# Patient Record
Sex: Male | Born: 1955 | Race: White | Hispanic: No | Marital: Single | Smoking: Current every day smoker
Health system: Southern US, Community
[De-identification: ages and names within clinical notes are randomized; demographics above are authoritative.]

## PROBLEM LIST (undated history)

## (undated) DIAGNOSIS — I2510 Atherosclerotic heart disease of native coronary artery without angina pectoris: Secondary | ICD-10-CM

## (undated) DIAGNOSIS — D649 Anemia, unspecified: Secondary | ICD-10-CM

## (undated) DIAGNOSIS — I219 Acute myocardial infarction, unspecified: Secondary | ICD-10-CM

## (undated) DIAGNOSIS — M75100 Unspecified rotator cuff tear or rupture of unspecified shoulder, not specified as traumatic: Secondary | ICD-10-CM

## (undated) DIAGNOSIS — Z720 Tobacco use: Secondary | ICD-10-CM

## (undated) DIAGNOSIS — M479 Spondylosis, unspecified: Secondary | ICD-10-CM

## (undated) DIAGNOSIS — N200 Calculus of kidney: Secondary | ICD-10-CM

## (undated) DIAGNOSIS — M549 Dorsalgia, unspecified: Secondary | ICD-10-CM

## (undated) DIAGNOSIS — I10 Essential (primary) hypertension: Secondary | ICD-10-CM

## (undated) DIAGNOSIS — J449 Chronic obstructive pulmonary disease, unspecified: Secondary | ICD-10-CM

## (undated) DIAGNOSIS — I5032 Chronic diastolic (congestive) heart failure: Secondary | ICD-10-CM

## (undated) DIAGNOSIS — G8929 Other chronic pain: Secondary | ICD-10-CM

## (undated) HISTORY — DX: Calculus of kidney: N20.0

## (undated) HISTORY — DX: Atherosclerotic heart disease of native coronary artery without angina pectoris: I25.10

## (undated) HISTORY — PX: OTHER SURGICAL HISTORY: SHX169

## (undated) HISTORY — DX: Dorsalgia, unspecified: M54.9

## (undated) HISTORY — PX: WRIST SURGERY: SHX841

## (undated) HISTORY — DX: Unspecified rotator cuff tear or rupture of unspecified shoulder, not specified as traumatic: M75.100

## (undated) HISTORY — DX: Other chronic pain: G89.29

## (undated) HISTORY — PX: NOSE SURGERY: SHX723

## (undated) HISTORY — DX: Acute myocardial infarction, unspecified: I21.9

## (undated) HISTORY — DX: Chronic diastolic (congestive) heart failure: I50.32

## (undated) HISTORY — DX: Spondylosis, unspecified: M47.9

## (undated) HISTORY — DX: Anemia, unspecified: D64.9

## (undated) HISTORY — DX: Chronic obstructive pulmonary disease, unspecified: J44.9

## (undated) HISTORY — PX: KNEE SURGERY: SHX244

## (undated) HISTORY — PX: SHOULDER SURGERY: SHX246

## (undated) HISTORY — DX: Tobacco use: Z72.0

## (undated) HISTORY — PX: CARPAL TUNNEL RELEASE: SHX101

## (undated) HISTORY — DX: Essential (primary) hypertension: I10

---

## 1998-02-11 ENCOUNTER — Emergency Department (HOSPITAL_COMMUNITY): Admission: EM | Admit: 1998-02-11 | Discharge: 1998-02-11 | Payer: Self-pay | Admitting: Emergency Medicine

## 1998-04-23 ENCOUNTER — Emergency Department (HOSPITAL_COMMUNITY): Admission: EM | Admit: 1998-04-23 | Discharge: 1998-04-23 | Payer: Self-pay | Admitting: Emergency Medicine

## 1998-04-23 ENCOUNTER — Encounter: Payer: Self-pay | Admitting: Emergency Medicine

## 2000-09-05 ENCOUNTER — Emergency Department (HOSPITAL_COMMUNITY): Admission: EM | Admit: 2000-09-05 | Discharge: 2000-09-05 | Payer: Self-pay | Admitting: Emergency Medicine

## 2000-09-05 ENCOUNTER — Encounter: Payer: Self-pay | Admitting: Emergency Medicine

## 2000-12-01 ENCOUNTER — Encounter: Payer: Self-pay | Admitting: *Deleted

## 2000-12-01 ENCOUNTER — Emergency Department (HOSPITAL_COMMUNITY): Admission: EM | Admit: 2000-12-01 | Discharge: 2000-12-01 | Payer: Self-pay | Admitting: *Deleted

## 2000-12-07 ENCOUNTER — Emergency Department (HOSPITAL_COMMUNITY): Admission: EM | Admit: 2000-12-07 | Discharge: 2000-12-07 | Payer: Self-pay | Admitting: Family Medicine

## 2001-02-14 ENCOUNTER — Encounter: Payer: Self-pay | Admitting: *Deleted

## 2001-02-14 ENCOUNTER — Emergency Department (HOSPITAL_COMMUNITY): Admission: EM | Admit: 2001-02-14 | Discharge: 2001-02-14 | Payer: Self-pay | Admitting: *Deleted

## 2001-02-17 ENCOUNTER — Emergency Department (HOSPITAL_COMMUNITY): Admission: EM | Admit: 2001-02-17 | Discharge: 2001-02-17 | Payer: Self-pay | Admitting: Internal Medicine

## 2001-02-19 ENCOUNTER — Emergency Department (HOSPITAL_COMMUNITY): Admission: EM | Admit: 2001-02-19 | Discharge: 2001-02-19 | Payer: Self-pay | Admitting: *Deleted

## 2001-08-06 ENCOUNTER — Emergency Department (HOSPITAL_COMMUNITY): Admission: EM | Admit: 2001-08-06 | Discharge: 2001-08-06 | Payer: Self-pay | Admitting: Emergency Medicine

## 2001-10-21 ENCOUNTER — Encounter: Payer: Self-pay | Admitting: Emergency Medicine

## 2001-10-21 ENCOUNTER — Emergency Department (HOSPITAL_COMMUNITY): Admission: EM | Admit: 2001-10-21 | Discharge: 2001-10-22 | Payer: Self-pay | Admitting: Emergency Medicine

## 2002-05-08 ENCOUNTER — Emergency Department (HOSPITAL_COMMUNITY): Admission: EM | Admit: 2002-05-08 | Discharge: 2002-05-08 | Payer: Self-pay | Admitting: Emergency Medicine

## 2002-05-08 ENCOUNTER — Encounter: Payer: Self-pay | Admitting: Emergency Medicine

## 2002-12-25 ENCOUNTER — Emergency Department (HOSPITAL_COMMUNITY): Admission: EM | Admit: 2002-12-25 | Discharge: 2002-12-25 | Payer: Self-pay | Admitting: Internal Medicine

## 2002-12-28 ENCOUNTER — Emergency Department (HOSPITAL_COMMUNITY): Admission: EM | Admit: 2002-12-28 | Discharge: 2002-12-28 | Payer: Self-pay | Admitting: *Deleted

## 2003-01-02 ENCOUNTER — Emergency Department (HOSPITAL_COMMUNITY): Admission: EM | Admit: 2003-01-02 | Discharge: 2003-01-02 | Payer: Self-pay | Admitting: Emergency Medicine

## 2003-01-20 ENCOUNTER — Emergency Department (HOSPITAL_COMMUNITY): Admission: EM | Admit: 2003-01-20 | Discharge: 2003-01-20 | Payer: Self-pay | Admitting: *Deleted

## 2003-01-24 ENCOUNTER — Emergency Department (HOSPITAL_COMMUNITY): Admission: EM | Admit: 2003-01-24 | Discharge: 2003-01-25 | Payer: Self-pay | Admitting: Emergency Medicine

## 2003-02-14 ENCOUNTER — Ambulatory Visit (HOSPITAL_BASED_OUTPATIENT_CLINIC_OR_DEPARTMENT_OTHER): Admission: RE | Admit: 2003-02-14 | Discharge: 2003-02-14 | Payer: Self-pay | Admitting: Urology

## 2003-11-22 ENCOUNTER — Emergency Department: Payer: Self-pay | Admitting: Unknown Physician Specialty

## 2004-09-26 ENCOUNTER — Emergency Department: Payer: Self-pay | Admitting: Emergency Medicine

## 2009-01-15 ENCOUNTER — Emergency Department: Payer: Self-pay | Admitting: Emergency Medicine

## 2009-12-18 ENCOUNTER — Emergency Department: Payer: Self-pay | Admitting: Emergency Medicine

## 2010-01-01 ENCOUNTER — Emergency Department: Payer: Self-pay | Admitting: Emergency Medicine

## 2010-12-04 ENCOUNTER — Emergency Department: Payer: Self-pay | Admitting: *Deleted

## 2011-01-28 HISTORY — PX: OTHER SURGICAL HISTORY: SHX169

## 2011-02-03 ENCOUNTER — Emergency Department: Payer: Self-pay | Admitting: *Deleted

## 2011-04-27 ENCOUNTER — Emergency Department: Payer: Self-pay | Admitting: Emergency Medicine

## 2011-04-27 LAB — URINALYSIS, COMPLETE
Bilirubin,UR: NEGATIVE
Glucose,UR: NEGATIVE mg/dL (ref 0–75)
Ketone: NEGATIVE
Ph: 5 (ref 4.5–8.0)
Protein: NEGATIVE
RBC,UR: 6 /HPF (ref 0–5)
Specific Gravity: 1.031 (ref 1.003–1.030)
Squamous Epithelial: NONE SEEN
WBC UR: 1 /HPF (ref 0–5)

## 2011-04-27 LAB — DRUG SCREEN, URINE
Benzodiazepine, Ur Scrn: NEGATIVE (ref ?–200)
Cannabinoid 50 Ng, Ur ~~LOC~~: NEGATIVE (ref ?–50)
MDMA (Ecstasy)Ur Screen: NEGATIVE (ref ?–500)
Opiate, Ur Screen: POSITIVE (ref ?–300)
Tricyclic, Ur Screen: NEGATIVE (ref ?–1000)

## 2011-05-10 ENCOUNTER — Emergency Department (HOSPITAL_COMMUNITY)
Admission: EM | Admit: 2011-05-10 | Discharge: 2011-05-10 | Disposition: A | Discharge status: Home / Self Care | Payer: Self-pay | Attending: Emergency Medicine | Admitting: Emergency Medicine

## 2011-05-10 ENCOUNTER — Encounter (HOSPITAL_COMMUNITY): Payer: Self-pay

## 2011-05-10 DIAGNOSIS — M549 Dorsalgia, unspecified: Secondary | ICD-10-CM

## 2011-05-10 DIAGNOSIS — G8929 Other chronic pain: Secondary | ICD-10-CM | POA: Insufficient documentation

## 2011-05-10 DIAGNOSIS — F17200 Nicotine dependence, unspecified, uncomplicated: Secondary | ICD-10-CM | POA: Insufficient documentation

## 2011-05-10 MED ORDER — DIAZEPAM 5 MG PO TABS: 5.0000 mg | ORAL_TABLET | Freq: Three times a day (TID) | ORAL | Status: AC | PRN

## 2011-05-10 MED ORDER — IBUPROFEN 800 MG PO TABS: 800.0000 mg | ORAL_TABLET | Freq: Three times a day (TID) | ORAL | Status: AC | PRN

## 2011-05-10 MED ORDER — HYDROCODONE-ACETAMINOPHEN 5-325 MG PO TABS: 1.0000 | ORAL_TABLET | Freq: Four times a day (QID) | ORAL | Status: AC | PRN

## 2011-05-10 NOTE — ED Provider Notes (Signed)
History     CSN: [id number]  Arrival date & time 05/10/11  1224   First MD Initiated Contact with Patient 05/10/11 1309      Chief Complaint  Patient presents with  . Back Pain    radiaiting to hips and legs    (Consider location/radiation/quality/duration/timing/severity/associated sxs/prior treatment) HPI Comments: Patient with history of chronic back pain with 2 herniated discs, cared for by neurosurgery at [facility], reports he has been taking care of a homebound person and doing a lot of heavy lifting recently, then today came to [place] to [place] someone's yard and developed worsening back pain.  His back pain is located in his lower back, radiates into his bilateral buttocks and down the back of both of his legs to the level of his knee.  Pain is currently 10/10.  States this feels exactly like his typical back pain exacerbation.  Denies fevers, N/V, abdominal pain, loss of control of bowel or bladder, weakness or numbness of his legs.    Patient is a 56 y.o. male presenting with back pain. The history is provided by the patient.  Back Pain  Pertinent negatives include no fever, no numbness, no abdominal pain, no dysuria and no weakness.    History reviewed. No pertinent past medical history.  History reviewed. No pertinent past surgical history.  No family history on file.  History  Substance Use Topics  . Smoking status: Current Some Day Smoker  . Smokeless tobacco: Not on file  . Alcohol Use: Yes      Review of Systems  Constitutional: Negative for fever.  Gastrointestinal: Negative for nausea, vomiting, abdominal pain, diarrhea and constipation.  Genitourinary: Negative for dysuria, urgency and frequency.  Musculoskeletal: Positive for back pain.  Neurological: Negative for weakness and numbness.  All other systems reviewed and are negative.    Allergies  Review of patient's allergies indicates no known allergies.  Home Medications   Current Outpatient Rx    Name Route Sig Dispense Refill  . ACETAMINOPHEN 500 MG PO TABS Oral Take 1,000 mg by mouth every 6 (six) hours as needed. pain    . ALBUTEROL SULFATE HFA 108 (90 BASE) MCG/ACT IN AERS Inhalation Inhale 2 puffs into the lungs every 6 (six) hours as needed. Wheezing and shortness of breath      BP 138/78  Pulse 54  Temp(Src) 97.7 F (36.5 C) (Oral)  Resp 20  SpO2 97%  Physical Exam  Nursing note and vitals reviewed. Constitutional: He is oriented to person, place, and time. He appears well-developed and well-nourished.  HENT:  Head: Normocephalic and atraumatic.  Neck: Normal range of motion. Neck supple.  Cardiovascular: Normal rate and regular rhythm.   Pulmonary/Chest: Effort normal and breath sounds normal.  Abdominal: Soft. He exhibits no distension and no mass. There is no tenderness. There is no rebound and no guarding.  Musculoskeletal: He exhibits no edema and no tenderness.       Cervical back: Normal.       Thoracic back: Normal.       Lumbar back: He exhibits decreased range of motion and pain. He exhibits no swelling, no edema and no deformity.  Neurological: He is alert and oriented to person, place, and time. He has normal strength. No sensory deficit. He exhibits normal muscle tone. Coordination and gait normal. GCS eye subscore is 4. GCS verbal subscore is 5. GCS motor subscore is 6.  Psychiatric: He has a normal mood and affect. His behavior is normal. Judgment  and thought content normal.    ED Course  Procedures (including critical care time)  Labs Reviewed - No data to display No results found.   1. Chronic back pain       MDM  Patient with chronic back pain, and recent heavy lifting, and activity.  Patient reports increase in chronic low back pain that is consistent with his typical flares of back pain.  No symptoms or physical findings concerning for cord compression or major nerve impingement.  Patient plans to followup with his doctors at [facility], but  currently does not have a primary care doctor or anyone to manage his pain.  Patient discharged home with pain medications with followup with his known doctors.  Patient verbalizes understanding and agrees with plan.        Rise Patience, [place] 05/10/11 1344

## 2011-05-10 NOTE — ED Notes (Signed)
Pt in from home with lower back pain radiating to hips and legs pt has a hx of chronic back pain denies recent injury

## 2011-05-10 NOTE — Discharge Instructions (Signed)
Please followup with your doctors at [facility].  Take the medications as prescribed.  It is important that you follow the same doctor for management of your chronic pain.  If you develop weakness or numbness, difficulty walking, fevers, loss of control of bowel or bladder, please go to the nearest emergency department immediately.  You may return to the ER at any time for worsening condition or any new symptoms that concern you.   Chronic Back Pain When back pain lasts longer than 3 months, it is called chronic back pain.This pain can be frustrating, but the cause of the pain is rarely dangerous.People with chronic back pain often go through certain periods that are more intense (flare-ups). CAUSES Chronic back pain can be caused by wear and tear (degeneration) on different structures in your back. These structures may include bones, ligaments, or discs. This degeneration may result in more pressure being placed on the nerves that travel to your legs and feet. This can lead to pain traveling from the low back down the back of the legs. When pain lasts longer than 3 months, it is not unusual for people to experience anxiety or depression. Anxiety and depression can also contribute to low back pain. TREATMENT  Establish a regular exercise plan. This is critical to improving your functional level.   Have a self-management plan for when you flare-up. Flare-ups rarely require a medical visit. Regular exercise will help reduce the intensity and frequency of your flare-ups.   Manage how you feel about your back pain and the rest of your life. Anxiety, depression, and feeling that you cannot alter your back pain have been shown to make back pain more intense and debilitating.   Medicines should never be your only treatment. They should be used along with other treatments to help you return to a more active lifestyle.   Procedures such as injections or surgery may be helpful but are rarely necessary. You may be  able to get the same results with physical therapy or chiropractic care.  HOME CARE INSTRUCTIONS  Avoid bending, heavy lifting, prolonged sitting, and activities which make the problem worse.   Continue normal activity as much as possible.   Take brief periods of rest throughout the day to reduce your pain during flare-ups.   Follow your back exercise rehabilitation program. This can help reduce symptoms and prevent more pain.   Only take over-the-counter or prescription medicines as directed by your caregiver. Muscle relaxants are sometimes prescribed. Narcotic pain medicine is discouraged for long-term pain, since addiction is a possible outcome.   If you smoke, quit.   Eat healthy foods and maintain a recommended body weight.  SEEK IMMEDIATE MEDICAL CARE IF:   You have weakness or numbness in one of your legs or feet.   You have trouble controlling your bladder or bowels.   You develop nausea, vomiting, abdominal pain, shortness of breath, or fainting.  Document Released: 02/21/2004 Document Revised: 01/02/2011 Document Reviewed: 12/28/2010 [facility] Patient Information 2012 [place], [place].  Chronic Pain Management Managing chronic pain is not easy. The goal is to provide as much pain relief as possible. There are emotional as well as physical problems. Chronic pain may lead to symptoms of depression which magnify those of the pain. Problems may include:  Anxiety.   Sleep disturbances.   Confused thinking.   Feeling cranky.   Fatigue.   Weight gain or loss.  Identify the source of the pain first, if possible. The pain may be masking another  problem. Try to find a pain management specialist or clinic. Work with a team to create a treatment plan for you. MEDICATIONS  May include narcotics or opioids. Larger than normal doses may be needed to control your pain.   Drugs for depression may help.   Over-the-counter medicines may help for some conditions. These drugs may  be used along with others for better pain relief.   May be injected into sites such as the spine and joints. Injections may have to be repeated if they wear off.  THERAPY MAY INCLUDE:  Working with a physical therapist to keep from getting stiff.   Regular, gentle exercise.   Cognitive or behavioral therapy.   Using complementary or integrative medicine such as:   Acupuncture.   Massage, Reiki, or Rolfing.   Aroma, color, light, or sound therapy.   Group support.  FOR MORE INFORMATION [email or web address]. American Chronic Pain Association [email or web address]. Document Released: 02/21/2004 Document Revised: 01/02/2011 Document Reviewed: 04/01/2007 [facility] Patient Information 2012 [place], [place].

## 2011-05-24 NOTE — ED Provider Notes (Signed)
Evaluation and management procedures were performed by the PA/NP/resident physician under my supervision/collaboration.   Neeraj Housand D Xandra Laramee, MD 05/24/11 1925 

## 2011-06-14 ENCOUNTER — Emergency Department: Payer: Self-pay | Admitting: *Deleted

## 2011-07-14 ENCOUNTER — Emergency Department: Payer: Self-pay | Admitting: *Deleted

## 2011-07-14 LAB — CBC
HGB: 13.6 g/dL (ref 13.0–18.0)
MCH: 33.4 pg (ref 26.0–34.0)
MCV: 97 fL (ref 80–100)
Platelet: 215 10*3/uL (ref 150–440)
RBC: 4.08 10*6/uL — ABNORMAL LOW (ref 4.40–5.90)

## 2011-07-14 LAB — URINALYSIS, COMPLETE
Bacteria: NONE SEEN
Bilirubin,UR: NEGATIVE
RBC,UR: 3 /HPF (ref 0–5)
Specific Gravity: 1.028 (ref 1.003–1.030)
Squamous Epithelial: <1
WBC UR: 9 /HPF (ref 0–5)

## 2011-07-14 LAB — COMPREHENSIVE METABOLIC PANEL
Alkaline Phosphatase: 112 U/L (ref 50–136)
Co2: 27 mmol/L (ref 21–32)
Creatinine: 0.72 mg/dL (ref 0.60–1.30)
EGFR (African American): >60
EGFR (Non-African Amer.): >60
Glucose: 96 mg/dL (ref 65–99)
Osmolality: 277 (ref 275–301)
Potassium: 3.6 mmol/L (ref 3.5–5.1)
SGOT(AST): 57 U/L — ABNORMAL HIGH (ref 15–37)
SGPT (ALT): 79 U/L — ABNORMAL HIGH
Total Protein: 7.6 g/dL (ref 6.4–8.2)

## 2011-08-18 ENCOUNTER — Emergency Department: Payer: Self-pay | Admitting: *Deleted

## 2011-10-06 ENCOUNTER — Encounter (HOSPITAL_COMMUNITY): Payer: Self-pay | Admitting: Family Medicine

## 2011-10-06 ENCOUNTER — Emergency Department (HOSPITAL_COMMUNITY)
Admission: EM | Admit: 2011-10-06 | Discharge: 2011-10-06 | Disposition: A | Discharge status: Home / Self Care | Payer: Medicaid Other | Attending: Emergency Medicine | Admitting: Emergency Medicine

## 2011-10-06 ENCOUNTER — Emergency Department (HOSPITAL_COMMUNITY): Payer: Medicaid Other

## 2011-10-06 DIAGNOSIS — Z87442 Personal history of urinary calculi: Secondary | ICD-10-CM | POA: Insufficient documentation

## 2011-10-06 DIAGNOSIS — M545 Low back pain, unspecified: Secondary | ICD-10-CM | POA: Insufficient documentation

## 2011-10-06 DIAGNOSIS — M533 Sacrococcygeal disorders, not elsewhere classified: Secondary | ICD-10-CM

## 2011-10-06 DIAGNOSIS — W1809XA Striking against other object with subsequent fall, initial encounter: Secondary | ICD-10-CM | POA: Insufficient documentation

## 2011-10-06 DIAGNOSIS — F17200 Nicotine dependence, unspecified, uncomplicated: Secondary | ICD-10-CM | POA: Insufficient documentation

## 2011-10-06 MED ORDER — ACETAMINOPHEN-CODEINE #3 300-30 MG PO TABS: 1.0000 | ORAL_TABLET | Freq: Four times a day (QID) | ORAL | Status: AC | PRN

## 2011-10-06 NOTE — ED Provider Notes (Signed)
History     CSN: [id number]  Arrival date & time 10/06/11  1458   First MD Initiated Contact with Patient 10/06/11 1658      Chief Complaint  Patient presents with  . Fall  . Leg Pain  . 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 The other person fell on top of him. He states he fell right onto his tail bone. He now has "really bad" no bone pain and low back pain. 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 He does admit to pain shooting down his left leg, however he states this is nothing different from his normal pain. Denies any bowel or bladder dysfunction or saddle anesthesia. Denies pain with defecation. Pain worse with sitting on his tailbone and walking. He took an aspirin this morning without any relief of his pain. He normally takes Tylenol for his chronic back pain.  Patient is a 56 y.o. male presenting with fall, leg pain, and back pain. The history is provided by the patient.  Fall Pertinent negatives include no abdominal pain. Numbness: no worse than his "normal"  Leg Pain  Numbness: no worse than his "normal"  Back Pain  Associated symptoms include leg pain. Pertinent negatives include no chest pain, no abdominal pain and no weakness. Numbness: no worse than his "normal"    Past Medical History  Diagnosis Date  . Kidney stones     Past Surgical History  Procedure Date  . Shoulder surgery   . Knee surgery   . Wrist surgery   . Nose surgery     History reviewed. No pertinent family history.  History  Substance Use Topics  . Smoking status: Current Some Day Smoker  . Smokeless tobacco: Not on file  . Alcohol Use: Yes      Review of Systems  Constitutional: Negative for  activity change.  HENT: Negative for neck pain and neck stiffness.   Respiratory: Negative for shortness of breath.   Cardiovascular: Negative for chest pain.  Gastrointestinal: Negative for abdominal pain.       No bowel dysfunction. No pain with defacation  Genitourinary:       No urinary dysfunction  Musculoskeletal: Positive for back pain and gait problem.  Skin: Negative for color change.  Neurological: Negative for dizziness, weakness and light-headedness. Numbness: no worse than his "normal"    Allergies  Review of patient's allergies indicates no known allergies.  Home Medications   Current Outpatient Rx  Name Route Sig Dispense Refill  . ACETAMINOPHEN 500 MG PO TABS Oral Take 1,000 mg by mouth every 6 (six) hours as needed. pain    . ALBUTEROL SULFATE HFA 108 (90 BASE) MCG/ACT IN AERS Inhalation Inhale 2 puffs into the lungs every 6 (six) hours as needed. Wheezing and shortness of breath    . ASPIRIN EC 81 MG PO TBEC Oral Take 81 mg by mouth once.      BP 142/101  Pulse 112  Temp 98.2 F (36.8 C) (Oral)  Resp 18  SpO2 97%  Physical Exam  Constitutional: He is oriented to person, place, and time. He appears well-developed and well-nourished. No distress.  HENT:  Head: Normocephalic and atraumatic.  Mouth/Throat: Oropharynx is clear and moist.  Eyes:  Conjunctivae are normal.  Neck: Normal range of motion. Neck supple.  Cardiovascular: Normal rate, regular rhythm and normal heart sounds.   Pulmonary/Chest: Effort normal and breath sounds normal.  Abdominal: Soft. Bowel sounds are normal. There is no tenderness.  Musculoskeletal:       Lumbar back: He exhibits decreased range of motion and bony tenderness. He exhibits no swelling, no edema and normal pulse.       No paraspinal muscle tenderness. Tenderness to palpation of sacrum and coccyx.   Neurological: He is alert and oriented to person, place, and time. He has normal reflexes. No sensory deficit. Gait (due to  pain) abnormal.       Negative SLR bilaterally  Skin: Skin is warm and dry. No rash noted. No erythema.  Psychiatric: He has a normal mood and affect. His speech is normal and behavior is normal.    ED Course  Procedures (including critical care time)  Labs Reviewed - No data to display Dg Lumbar Spine Complete  10/06/2011  *RADIOLOGY REPORT*  Clinical Data: Fall.  Low back pain.  LUMBAR SPINE - COMPLETE 4+ VIEW  Comparison: No comparison studies available.  Findings: Bones are diffusely demineralized.  No evidence for fracture.  Degenerative spondylolisthesis is seen at L4-5.  There is loss of disc height at L5-S1. SI joints are unremarkable.  Dense calcification is seen in the wall of the abdominal aorta.  IMPRESSION: No evidence for lumbar spine fracture.   Original Report Authenticated By: ERIC A. MANSELL, M.D.    Dg Sacrum/coccyx  10/06/2011  *RADIOLOGY REPORT*  Clinical Data: Fall.  Sacral pain.  SACRUM AND COCCYX - 2+ VIEW  Comparison: None.  Findings: Three-view exam of the sacrum shows no evidence for fracture.  Arcuate lines are preserved on the frontal images.  SI joints are normal.  Visualized portions of the symphysis pubis are unremarkable.  IMPRESSION: No evidence for acute sacral fracture.   Original Report Authenticated By: ERIC A. MANSELL, M.D.      1. Low back pain   2. Coccyx pain       MDM  56 year old male with low back and tailbone pain after falling yesterday. He denies any neurologic symptoms out of his normal. No red flags concerning patient's back pain. No s/s of central cord compression or cauda equina. Lower extremities are neurovascularly intact. X-rays show no acute abnormality. Will discharge with pain control and discuss conservative measures such as icing and using a donut to sit on.        Trevor Mace, PA-C 10/06/11 [place]

## 2011-10-06 NOTE — ED Notes (Signed)
Pt reports he was trying to help someone who had fallen and the rug slipped out from under him and he landed on hit bottom. Reports pain to tailbone, lower back, left leg since the fall. Denies hitting head or loc. NAD noted at this time.

## 2011-10-07 NOTE — ED Provider Notes (Signed)
Medical screening examination/treatment/procedure(s) were performed by non-physician practitioner and as supervising physician I was immediately available for consultation/collaboration.   Gwyneth Sprout, MD 10/07/11 1346

## 2011-10-11 ENCOUNTER — Emergency Department: Payer: Self-pay | Admitting: Emergency Medicine

## 2011-11-19 ENCOUNTER — Ambulatory Visit: Payer: Self-pay

## 2011-12-13 LAB — COMPREHENSIVE METABOLIC PANEL
Albumin: 2.8 g/dL — ABNORMAL LOW (ref 3.4–5.0)
Alkaline Phosphatase: 133 U/L (ref 50–136)
BUN: 28 mg/dL — ABNORMAL HIGH (ref 7–18)
Chloride: 97 mmol/L — ABNORMAL LOW (ref 98–107)
EGFR (African American): >60
Glucose: 110 mg/dL — ABNORMAL HIGH (ref 65–99)
Osmolality: 274 (ref 275–301)
SGOT(AST): 286 U/L — ABNORMAL HIGH (ref 15–37)
SGPT (ALT): 741 U/L — ABNORMAL HIGH (ref 12–78)
Total Protein: 6.4 g/dL (ref 6.4–8.2)

## 2011-12-13 LAB — TROPONIN I: Troponin-I: 2.2 ng/mL — ABNORMAL HIGH

## 2011-12-13 LAB — CBC
HGB: 10.3 g/dL — ABNORMAL LOW (ref 13.0–18.0)
MCH: 31.7 pg (ref 26.0–34.0)
MCHC: 33.9 g/dL (ref 32.0–36.0)
MCV: 93 fL (ref 80–100)
Platelet: 132 10*3/uL — ABNORMAL LOW (ref 150–440)
RBC: 3.24 10*6/uL — ABNORMAL LOW (ref 4.40–5.90)
RDW: 13.7 % (ref 11.5–14.5)

## 2011-12-13 LAB — CK TOTAL AND CKMB (NOT AT ARMC)
CK, Total: 215 U/L (ref 35–232)
CK-MB: 5.4 ng/mL — ABNORMAL HIGH (ref 0.5–3.6)

## 2011-12-14 ENCOUNTER — Inpatient Hospital Stay: Payer: Self-pay | Admitting: Internal Medicine

## 2011-12-14 DIAGNOSIS — R0602 Shortness of breath: Secondary | ICD-10-CM

## 2011-12-14 DIAGNOSIS — I059 Rheumatic mitral valve disease, unspecified: Secondary | ICD-10-CM

## 2011-12-14 DIAGNOSIS — R7989 Other specified abnormal findings of blood chemistry: Secondary | ICD-10-CM

## 2011-12-14 LAB — APTT
Activated PTT: 52.8 secs — ABNORMAL HIGH (ref 23.6–35.9)
Activated PTT: 58.7 secs — ABNORMAL HIGH (ref 23.6–35.9)

## 2011-12-14 LAB — PROTIME-INR
INR: 1.2
Prothrombin Time: 15.4 secs — ABNORMAL HIGH (ref 11.5–14.7)

## 2011-12-14 LAB — TROPONIN I: Troponin-I: 1.8 ng/mL — ABNORMAL HIGH

## 2011-12-14 LAB — POTASSIUM: Potassium: 4.5 mmol/L (ref 3.5–5.1)

## 2011-12-15 DIAGNOSIS — I2510 Atherosclerotic heart disease of native coronary artery without angina pectoris: Secondary | ICD-10-CM

## 2011-12-15 HISTORY — PX: CARDIAC CATHETERIZATION: SHX172

## 2011-12-15 LAB — CBC WITH DIFFERENTIAL/PLATELET
Basophil %: 1 %
Eosinophil #: 0.1 10*3/uL (ref 0.0–0.7)
Eosinophil %: 0.8 %
HCT: 31.3 % — ABNORMAL LOW (ref 40.0–52.0)
HGB: 10.9 g/dL — ABNORMAL LOW (ref 13.0–18.0)
Lymphocyte #: 1.4 10*3/uL (ref 1.0–3.6)
MCH: 32.7 pg (ref 26.0–34.0)
MCHC: 34.8 g/dL (ref 32.0–36.0)
MCV: 94 fL (ref 80–100)
Monocyte #: 0.8 x10 3/mm (ref 0.2–1.0)
Monocyte %: 8 %
Neutrophil #: 8 10*3/uL — ABNORMAL HIGH (ref 1.4–6.5)
Platelet: 129 10*3/uL — ABNORMAL LOW (ref 150–440)
RBC: 3.33 10*6/uL — ABNORMAL LOW (ref 4.40–5.90)

## 2011-12-15 LAB — APTT: Activated PTT: 70 secs — ABNORMAL HIGH (ref 23.6–35.9)

## 2011-12-15 LAB — BASIC METABOLIC PANEL
BUN: 19 mg/dL — ABNORMAL HIGH (ref 7–18)
Calcium, Total: 8 mg/dL — ABNORMAL LOW (ref 8.5–10.1)
Chloride: 100 mmol/L (ref 98–107)
Co2: 33 mmol/L — ABNORMAL HIGH (ref 21–32)
Creatinine: 0.75 mg/dL (ref 0.60–1.30)
EGFR (African American): >60
EGFR (Non-African Amer.): >60
Glucose: 121 mg/dL — ABNORMAL HIGH (ref 65–99)
Osmolality: 277 (ref 275–301)
Potassium: 3.5 mmol/L (ref 3.5–5.1)

## 2011-12-15 LAB — LIPID PANEL
Ldl Cholesterol, Calc: 84 mg/dL (ref 0–100)
Triglycerides: 138 mg/dL (ref 0–200)
VLDL Cholesterol, Calc: 28 mg/dL (ref 5–40)

## 2011-12-15 LAB — PRO B NATRIURETIC PEPTIDE: B-Type Natriuretic Peptide: 1293 pg/mL — ABNORMAL HIGH (ref 0–125)

## 2011-12-15 LAB — HEMOGLOBIN A1C: Hemoglobin A1C: 6.1 % (ref 4.2–6.3)

## 2011-12-16 ENCOUNTER — Encounter: Payer: Self-pay | Admitting: Cardiovascular Disease

## 2011-12-16 DIAGNOSIS — I214 Non-ST elevation (NSTEMI) myocardial infarction: Secondary | ICD-10-CM

## 2011-12-16 LAB — COMPREHENSIVE METABOLIC PANEL
Alkaline Phosphatase: 99 U/L (ref 50–136)
Anion Gap: 5 — ABNORMAL LOW (ref 7–16)
BUN: 17 mg/dL (ref 7–18)
Bilirubin,Total: 1 mg/dL (ref 0.2–1.0)
Calcium, Total: 7.9 mg/dL — ABNORMAL LOW (ref 8.5–10.1)
Chloride: 108 mmol/L — ABNORMAL HIGH (ref 98–107)
Co2: 27 mmol/L (ref 21–32)
Creatinine: 0.64 mg/dL (ref 0.60–1.30)
EGFR (Non-African Amer.): >60
Osmolality: 280 (ref 275–301)
Potassium: 3.9 mmol/L (ref 3.5–5.1)
SGOT(AST): 67 U/L — ABNORMAL HIGH (ref 15–37)
SGPT (ALT): 271 U/L — ABNORMAL HIGH (ref 12–78)
Total Protein: 5.9 g/dL — ABNORMAL LOW (ref 6.4–8.2)

## 2011-12-29 ENCOUNTER — Encounter: Payer: Self-pay | Admitting: Cardiovascular Disease

## 2012-01-26 ENCOUNTER — Inpatient Hospital Stay: Payer: Self-pay | Admitting: Family Medicine

## 2012-01-26 LAB — CBC
HCT: 34.7 % — ABNORMAL LOW (ref 40.0–52.0)
HGB: 11.6 g/dL — ABNORMAL LOW (ref 13.0–18.0)
MCH: 29.2 pg (ref 26.0–34.0)
MCHC: 33.5 g/dL (ref 32.0–36.0)
MCV: 87 fL (ref 80–100)
Platelet: 260 10*3/uL (ref 150–440)
RBC: 3.98 10*6/uL — ABNORMAL LOW (ref 4.40–5.90)

## 2012-01-26 LAB — BASIC METABOLIC PANEL
BUN: 12 mg/dL (ref 7–18)
Creatinine: 0.69 mg/dL (ref 0.60–1.30)
EGFR (African American): >60
EGFR (Non-African Amer.): >60
Glucose: 101 mg/dL — ABNORMAL HIGH (ref 65–99)
Sodium: 143 mmol/L (ref 136–145)

## 2012-01-26 LAB — CK TOTAL AND CKMB (NOT AT ARMC): CK, Total: 58 U/L (ref 35–232)

## 2012-01-27 DIAGNOSIS — R079 Chest pain, unspecified: Secondary | ICD-10-CM

## 2012-01-27 LAB — CBC WITH DIFFERENTIAL/PLATELET
Eosinophil #: 0.4 10*3/uL (ref 0.0–0.7)
Eosinophil %: 3.6 %
HCT: 32.2 % — ABNORMAL LOW (ref 40.0–52.0)
HGB: 10.7 g/dL — ABNORMAL LOW (ref 13.0–18.0)
Lymphocyte #: 2.8 10*3/uL (ref 1.0–3.6)
MCHC: 33.1 g/dL (ref 32.0–36.0)
Monocyte #: 1 x10 3/mm (ref 0.2–1.0)
Neutrophil #: 5.5 10*3/uL (ref 1.4–6.5)
Neutrophil %: 56.3 %
Platelet: 218 10*3/uL (ref 150–440)
RDW: 15.6 % — ABNORMAL HIGH (ref 11.5–14.5)
WBC: 9.9 10*3/uL (ref 3.8–10.6)

## 2012-01-27 LAB — BASIC METABOLIC PANEL
Anion Gap: 6 — ABNORMAL LOW (ref 7–16)
Calcium, Total: 8.5 mg/dL (ref 8.5–10.1)
Chloride: 108 mmol/L — ABNORMAL HIGH (ref 98–107)
Co2: 28 mmol/L (ref 21–32)
Creatinine: 0.82 mg/dL (ref 0.60–1.30)
EGFR (African American): >60
EGFR (Non-African Amer.): >60
Glucose: 88 mg/dL (ref 65–99)
Osmolality: 283 (ref 275–301)
Potassium: 3.9 mmol/L (ref 3.5–5.1)

## 2012-01-27 LAB — TROPONIN I
Troponin-I: 0.02 ng/mL
Troponin-I: <0.02 ng/mL

## 2012-01-27 LAB — CK TOTAL AND CKMB (NOT AT ARMC): CK, Total: 49 U/L (ref 35–232)

## 2012-01-29 ENCOUNTER — Telehealth: Payer: Self-pay | Admitting: Cardiovascular Disease

## 2012-01-29 ENCOUNTER — Encounter: Payer: Self-pay | Admitting: *Deleted

## 2012-01-29 NOTE — Telephone Encounter (Signed)
Error

## 2012-02-04 ENCOUNTER — Ambulatory Visit (INDEPENDENT_AMBULATORY_CARE_PROVIDER_SITE_OTHER): Payer: Medicaid Other | Admitting: Nurse Practitioner

## 2012-02-04 ENCOUNTER — Encounter: Payer: Self-pay | Admitting: Nurse Practitioner

## 2012-02-04 VITALS — BP 170/100 | HR 88 | Ht 70.0 in | Wt 226.2 lb

## 2012-02-04 DIAGNOSIS — Z720 Tobacco use: Secondary | ICD-10-CM | POA: Insufficient documentation

## 2012-02-04 DIAGNOSIS — I10 Essential (primary) hypertension: Secondary | ICD-10-CM

## 2012-02-04 DIAGNOSIS — I2510 Atherosclerotic heart disease of native coronary artery without angina pectoris: Secondary | ICD-10-CM | POA: Insufficient documentation

## 2012-02-04 DIAGNOSIS — I509 Heart failure, unspecified: Secondary | ICD-10-CM

## 2012-02-04 DIAGNOSIS — I5032 Chronic diastolic (congestive) heart failure: Secondary | ICD-10-CM

## 2012-02-04 DIAGNOSIS — R0602 Shortness of breath: Secondary | ICD-10-CM

## 2012-02-04 DIAGNOSIS — J449 Chronic obstructive pulmonary disease, unspecified: Secondary | ICD-10-CM | POA: Insufficient documentation

## 2012-02-04 DIAGNOSIS — F17200 Nicotine dependence, unspecified, uncomplicated: Secondary | ICD-10-CM

## 2012-02-04 DIAGNOSIS — M479 Spondylosis, unspecified: Secondary | ICD-10-CM | POA: Insufficient documentation

## 2012-02-04 MED ORDER — CARVEDILOL 12.5 MG PO TABS: 12.5000 mg | ORAL_TABLET | Freq: Two times a day (BID) | ORAL | Status: DC

## 2012-02-04 NOTE — Patient Instructions (Addendum)
Your physician wants you to follow-up in: 3 months with Dr. Kirke Corin. You will receive a reminder letter in the mail two months in advance. If you don't receive a letter, please call our office to schedule the follow-up appointment.  Your physician has recommended you make the following change in your medication:  -increase coreg/carvedilol to 12.5 mg twice daily

## 2012-02-04 NOTE — Progress Notes (Signed)
Patient Name: Walter [name] Date of Encounter: 02/04/2012  Primary Care Provider:  Pcp Not In System Primary Cardiologist:  M. Kirke Corin, MD  Patient Profile  57 year old male who presents for clinic followup after recent hospitalization.  Problem List   Past Medical History  Diagnosis Date  . Hypertension   . Degenerative joint disease of spine     a. 11/2011 s/p L4-L5 fusion.  . Chronic back pain   . Chronic diastolic CHF (congestive heart failure)   . COPD (chronic obstructive pulmonary disease)   . CAD (coronary artery disease)     a. 11/2011 NSTEMI/Cath: subtotal occlusion of the mid RCA with L->R collats, nl LV fxn-->medical Rx;  . Nephrolithiasis   . Tobacco abuse     a. 80+ pack year hx   Past Surgical History  Procedure Date  . Shoulder surgery   . Knee surgery   . Wrist surgery   . Nose surgery   . Carpal tunnel release   . Kidney stone     removed  . L4-l5 spine surgery 2013  . Cardiac catheterization 12/15/2011    Allergies  Allergies  Allergen Reactions  . Ibuprofen   . Tramadol     HPI  57 year old male with the above problem list. He underwent diagnostic catheterization in November of 2013 secondary to a non-STEMI following back surgery. He was found to have a subtotal occlusion of the right coronary artery and was medically managed. He was readmitted December 30 secondary to atypical chest pain hypertension and diastolic CHF. He ruled out for MI and subsequently seen by cardiology and then discharged home. Since his discharge, he has done well. He has had no recurrence of chest pain though he has chronic back pain. He is not very active at home. He has cut back on his smoking to just half a pack a day. He knows that he needs to quit. He denies PND, orthopnea, dizziness, syncope, edema, or early satiety. His blood pressure fluctuates quite a bit at home and generally runs high.  Home Medications  Prior to Admission medications   Medication Sig  Start Date End Date Taking? Authorizing Provider  acetaminophen (TYLENOL) 500 MG tablet Take 1,000 mg by mouth every 6 (six) hours as needed. pain   Yes Historical Provider, MD  albuterol (PROVENTIL HFA;VENTOLIN HFA) 108 (90 BASE) MCG/ACT inhaler Inhale 2 puffs into the lungs every 6 (six) hours as needed. Wheezing and shortness of breath   Yes Historical Provider, MD  aspirin EC 81 MG tablet Take 81 mg by mouth once.   Yes Historical Provider, MD  atorvastatin (LIPITOR) 40 MG tablet Take 40 mg by mouth daily.   Yes Historical Provider, MD  clopidogrel (PLAVIX) 75 MG tablet Take 75 mg by mouth daily.   Yes Historical Provider, MD  enalapril (VASOTEC) 2.5 MG tablet Takes 2 tablets daily.   Yes Historical Provider, MD  Fluticasone-Salmeterol (ADVAIR DISKUS) 250-50 MCG/DOSE AEPB Inhale 1 puff into the lungs every 12 (twelve) hours.   Yes Historical Provider, MD  furosemide (LASIX) 40 MG tablet Take 40 mg by mouth daily.   Yes Historical Provider, MD  gabapentin (NEURONTIN) 600 MG tablet Take 600 mg by mouth 3 (three) times daily.   Yes Historical Provider, MD  hydrochlorothiazide (HYDRODIURIL) 50 MG tablet Take 50 mg by mouth daily.   Yes Historical Provider, MD  oxycodone (OXY-IR) 5 MG capsule Take 5 mg by mouth every 6 (six) hours as needed.   Yes Historical Provider,  MD  oxyCODONE (OXYCONTIN) 20 MG 12 hr tablet Take 40 mg by mouth every 12 (twelve) hours.   Yes Historical Provider, MD  tiotropium (SPIRIVA) 18 MCG inhalation capsule Place 18 mcg into inhaler and inhale daily.   Yes Historical Provider, MD  carvedilol (COREG) 12.5 MG tablet Take 1 tablet (12.5 mg total) by mouth 2 (two) times daily. 02/04/12   Ok Anis, NP   Review of Systems  He has chronic back pain as noted above. All other systems reviewed and are otherwise negative except as noted above.  Physical Exam  Blood pressure 170/100, pulse 88, height 5\' 10"  (1.778 m), weight 226 lb 4 oz (102.626 kg).  General: Pleasant,  NAD Psych: Normal affect. Neuro: Alert and oriented X 3. Moves all extremities spontaneously. HEENT: Normal  Neck: Supple without bruits or JVD. Lungs:  Resp regular and unlabored, CTA. Heart: RRR no s3, s4, or murmurs. Abdomen: Soft, non-tender, non-distended, BS + x 4.  Extremities: No clubbing, cyanosis or edema. DP/PT/Radials 2+ and equal bilaterally.  Accessory Clinical Findings  ECG - regular sinus rhythm, 88, PACs, no acute ST or T changes.  Assessment & Plan  1.  Coronary artery disease: He is status post non-ST elevation MI and catheterization in November. He was recently admitted with atypical chest pain and subsequently ruled out. He has had no recurrence of chest pain since discharge. He has not had significant dyspnea on exertion. His blood pressure is elevated and I will titrate his carvedilol to 12.5 mg twice a day. He otherwise remains on aspirin, statin, Plavix, ACE inhibitor therapy.  2. Hypertension: Poorly controlled. Titrate carvedilol 12.5 mg twice a day. He'll check his blood pressure daily and call us back next week at which time we can decide whether or not to titrate his carvedilol further work titrate his enalapril.  3. Chronic diastolic CHF: There is for improvement of both heart rate and blood pressure and titrated beta blocker today.  4. Tobacco abuse: Cessation advised. He is down to half a pack a day.  5. Chronic back pain: Patient asked for oxycodone. I recommended he follow up with his primary care provider.  6. Disposition: Followup in 3 months.  Nicolasa Ducking, NP 02/04/2012, 4:58 PM

## 2012-02-05 LAB — BASIC METABOLIC PANEL
BUN/Creatinine Ratio: 20 (ref 9–20)
BUN: 16 mg/dL (ref 6–24)
CO2: 25 mmol/L (ref 19–28)
Calcium: 9.2 mg/dL (ref 8.7–10.2)
Chloride: 100 mmol/L (ref 97–108)
Creatinine, Ser: 0.8 mg/dL (ref 0.76–1.27)
GFR calc Af Amer: 116 mL/min/{1.73_m2} (ref >59)
GFR calc non Af Amer: 101 mL/min/{1.73_m2} (ref >59)
Glucose: 135 mg/dL — ABNORMAL HIGH (ref 65–99)
Potassium: 3.4 mmol/L — ABNORMAL LOW (ref 3.5–5.2)
Sodium: 141 mmol/L (ref 134–144)

## 2012-02-06 ENCOUNTER — Other Ambulatory Visit: Payer: Self-pay

## 2012-02-06 DIAGNOSIS — I509 Heart failure, unspecified: Secondary | ICD-10-CM

## 2012-02-06 MED ORDER — POTASSIUM CHLORIDE CRYS ER 20 MEQ PO TBCR: 40.0000 meq | EXTENDED_RELEASE_TABLET | Freq: Every day | ORAL | Status: DC

## 2012-02-10 ENCOUNTER — Telehealth: Payer: Self-pay | Admitting: *Deleted

## 2012-02-10 ENCOUNTER — Other Ambulatory Visit: Payer: Self-pay

## 2012-02-10 MED ORDER — ENALAPRIL MALEATE 20 MG PO TABS: 20.0000 mg | ORAL_TABLET | Freq: Every day | ORAL | Status: DC

## 2012-02-10 MED ORDER — CARVEDILOL 25 MG PO TABS: 25.0000 mg | ORAL_TABLET | Freq: Two times a day (BID) | ORAL | Status: DC

## 2012-02-10 NOTE — Telephone Encounter (Signed)
Spoke with patient per Sabino Snipes, RN need to have take extra carvedilol now and call us back in 45 minutes with a BP & HR reading.

## 2012-02-10 NOTE — Telephone Encounter (Signed)
Please see below and advise. thanks 

## 2012-02-10 NOTE — Telephone Encounter (Signed)
Patient calling back after he took two extra 5 mg tablets of enalapril and his BP now reading 183/109 with HR 84.

## 2012-02-10 NOTE — Telephone Encounter (Signed)
Notified patient per Dr. Kirke Corin need to increase the coreg to 25 mg twice a day and enalapril to 20 mg take one tablet daily with a follow up in one week. The patient also took BP again at his neighbors house which is 178/103 with HR 74. The patient is concerned and will probably go to the ER for evaluation.

## 2012-02-10 NOTE — Telephone Encounter (Signed)
Patient is taking Enalapril 2.5 mg twice a day; he was told to increase to 20 mg daily with an increase of carvedilol to  25 mg twice a day per Dr. Jari Sportsman instructions. The patient has taken an extra carvedilol and only (3) 2.5 mg tablets of enalapril because he was unsure of his enalapril dose and his BP now is 158/101 HR 81. Please advise. Do you recommend patient go to the ER.

## 2012-02-10 NOTE — Telephone Encounter (Signed)
FYI See below 

## 2012-02-10 NOTE — Telephone Encounter (Signed)
I spoke with pt and confirmed he has taken meds as follows today: enalapril 2.5 mg tablets x 4 (10 mg) Carvedilol 6.25 mg tablets x4 (25 mg) BP now=144/102, HR=82 I explained he has still not reached enalapril goal per Dr. Jari Sportsman last advise I explained ok to take 2.5 mg enalapril x 4 tabs to equal a total of enalapril 20 mg for today I also advised ok to take 25 mg carvedilol tonight at 9 or 10 pm to equal new dose per Dr. Jari Sportsman suggestion Understanding verb Says h/a has improved He will continue to monitor and let us know tomm how BP is doing

## 2012-02-10 NOTE — Telephone Encounter (Signed)
Patient calling with BP reading taken from pain clinic this morning 210/99, HR unknown. Dr. Fayrene Fearing with the Pain clinic in [facility] asked patient to contact cardiologist or if unable to contact, go to ER. Patient c/o headache, no dizziness, no numbness in face, shortness of breath or chest pain.  Patient seen by Brion Aliment on 02/04/2012 and mentioned that his carvedilol increased to 12.5 mg twice daily.  Patient has been taking medication as prescribed and still having elevated BP.  Patient is to f/u in 3 months with [place].  Please advise.

## 2012-02-10 NOTE — Telephone Encounter (Signed)
I advised Jasmine December to have pt take an extra 12.5 mg coreg since this was increased to 25 mg BID earlier today. He had yet to increase this, per pt.  We will call back to reassess in 1 hour.

## 2012-02-10 NOTE — Telephone Encounter (Signed)
Patient states took two enalapril 5 mg and took his BP which is now running 183/109,  HR 84. Patient is still c/o headache and not sure what to do.

## 2012-02-11 ENCOUNTER — Encounter: Payer: Self-pay | Admitting: Nurse Practitioner

## 2012-02-11 ENCOUNTER — Telehealth: Payer: Self-pay

## 2012-02-11 MED ORDER — POTASSIUM CHLORIDE CRYS ER 20 MEQ PO TBCR: 40.0000 meq | EXTENDED_RELEASE_TABLET | Freq: Every day | ORAL | Status: DC

## 2012-02-11 NOTE — Telephone Encounter (Signed)
Pt called this am to report BPs overnight Says he checked BP every hour overnight BP readings were as follows: 94/60 89/66 79/57  83/63 90/59 92/64  114/72 118/71 115/84 this am at 0730 HR=83-90 BPM Pt did not take carvedilol last PM d/t low BP Says he had some dizziness with the low BPs He took enalapril 5 mg this am with carvedilol 12.5 mg He will take enalapril 5 mg and carvedilol 12.5 mg tonight as well I advised him to continue to monitor BPs, but advised him to only check 3x a day to prevent false readings understanding verb I will inform Dr. Kirke Corin of new regimen

## 2012-02-11 NOTE — Telephone Encounter (Signed)
Continue Enalapril 5 mg and Coreg 12.5 mg bid. Take additional 12.5 mg of Coreg if SBP >160

## 2012-02-11 NOTE — Telephone Encounter (Signed)
Pt confirmed Understanding verb

## 2012-02-13 LAB — COMPREHENSIVE METABOLIC PANEL
Albumin: 3.6 g/dL (ref 3.4–5.0)
Alkaline Phosphatase: 127 U/L (ref 50–136)
Anion Gap: 8 (ref 7–16)
BUN: 22 mg/dL — ABNORMAL HIGH (ref 7–18)
Calcium, Total: 8.7 mg/dL (ref 8.5–10.1)
Chloride: 101 mmol/L (ref 98–107)
Co2: 28 mmol/L (ref 21–32)
Creatinine: 1.5 mg/dL — ABNORMAL HIGH (ref 0.60–1.30)
EGFR (African American): 60 — ABNORMAL LOW
EGFR (Non-African Amer.): 52 — ABNORMAL LOW
Glucose: 100 mg/dL — ABNORMAL HIGH (ref 65–99)
Osmolality: 277 (ref 275–301)
Potassium: 3.6 mmol/L (ref 3.5–5.1)
SGPT (ALT): 18 U/L (ref 12–78)
Total Protein: 7.5 g/dL (ref 6.4–8.2)

## 2012-02-13 LAB — URINALYSIS, COMPLETE
Bacteria: NONE SEEN
Bilirubin,UR: NEGATIVE
Hyaline Cast: 92
Ketone: NEGATIVE
Leukocyte Esterase: NEGATIVE
Nitrite: NEGATIVE
Ph: 5 (ref 4.5–8.0)
Specific Gravity: 1.011 (ref 1.003–1.030)

## 2012-02-13 LAB — CBC
HGB: 11.4 g/dL — ABNORMAL LOW (ref 13.0–18.0)
MCH: 29.4 pg (ref 26.0–34.0)
MCV: 87 fL (ref 80–100)
RBC: 3.87 10*6/uL — ABNORMAL LOW (ref 4.40–5.90)
RDW: 16.1 % — ABNORMAL HIGH (ref 11.5–14.5)
WBC: 9.6 10*3/uL (ref 3.8–10.6)

## 2012-02-13 LAB — CK TOTAL AND CKMB (NOT AT ARMC)
CK, Total: 76 U/L (ref 35–232)
CK-MB: 1.6 ng/mL (ref 0.5–3.6)

## 2012-02-14 ENCOUNTER — Observation Stay: Payer: Self-pay | Admitting: Internal Medicine

## 2012-02-14 LAB — TROPONIN I
Troponin-I: <0.02 ng/mL
Troponin-I: <0.02 ng/mL

## 2012-02-15 LAB — CBC WITH DIFFERENTIAL/PLATELET
Basophil #: 0 10*3/uL (ref 0.0–0.1)
Eosinophil %: 3.2 %
HCT: 34.6 % — ABNORMAL LOW (ref 40.0–52.0)
HGB: 11.6 g/dL — ABNORMAL LOW (ref 13.0–18.0)
Lymphocyte %: 20.5 %
MCH: 28.8 pg (ref 26.0–34.0)
MCHC: 33.4 g/dL (ref 32.0–36.0)
MCV: 86 fL (ref 80–100)
Monocyte %: 8.8 %
Neutrophil #: 4.8 10*3/uL (ref 1.4–6.5)
Neutrophil %: 67 %
Platelet: 193 10*3/uL (ref 150–440)
RDW: 16.2 % — ABNORMAL HIGH (ref 11.5–14.5)
WBC: 7.1 10*3/uL (ref 3.8–10.6)

## 2012-02-15 LAB — BASIC METABOLIC PANEL
BUN: 21 mg/dL — ABNORMAL HIGH (ref 7–18)
Creatinine: 0.67 mg/dL (ref 0.60–1.30)
EGFR (African American): >60
EGFR (Non-African Amer.): >60
Glucose: 107 mg/dL — ABNORMAL HIGH (ref 65–99)
Osmolality: 285 (ref 275–301)
Sodium: 141 mmol/L (ref 136–145)

## 2012-02-17 ENCOUNTER — Telehealth: Payer: Self-pay | Admitting: *Deleted

## 2012-02-17 NOTE — Telephone Encounter (Signed)
lmtcb

## 2012-02-17 NOTE — Telephone Encounter (Signed)
Pt says he was admitted to hospital over w/e for dehydration and low BP. He was told to hold diuretics until he sees [place]. They told him to stay on coreg and other meds until he sees [place]. He was discharged Sunday. Says he was told to see Dr. Kirke Corin this week to f/u Says he is feeling better since med changes He thought he had appt with Dr. Kirke Corin tomm but I explained Dr. Kirke Corin not here on wednesdays Has appt for labs Thurs I will see if we can see him this week.

## 2012-02-17 NOTE — Telephone Encounter (Signed)
Pt calling says that Melissa told him to be seen tomorrow by Dr Kirke Corin. i dont see any mention of that can you check into this and call pt.

## 2012-02-19 ENCOUNTER — Ambulatory Visit: Payer: Medicaid Other | Admitting: Cardiovascular Disease

## 2012-02-19 ENCOUNTER — Other Ambulatory Visit: Payer: Medicaid Other

## 2012-02-20 ENCOUNTER — Encounter: Payer: Self-pay | Admitting: *Deleted

## 2012-02-20 ENCOUNTER — Telehealth: Payer: Self-pay

## 2012-02-20 NOTE — Telephone Encounter (Signed)
LMTCB on home # and Cell # re: missed appt yesterday and due for BMP

## 2012-02-23 NOTE — Telephone Encounter (Signed)
Wife informed Understanding verb 

## 2012-02-23 NOTE — Telephone Encounter (Signed)
Error, wrong pt

## 2012-02-24 ENCOUNTER — Telehealth: Payer: Self-pay

## 2012-02-24 NOTE — Telephone Encounter (Signed)
FYI: I have talked with pt and attempted to reach pt several times [id number] appt with you and due for BMP Unable to speak with him

## 2012-02-24 NOTE — Telephone Encounter (Signed)
Mailbox is full.

## 2012-02-27 ENCOUNTER — Encounter: Payer: Self-pay | Admitting: Cardiovascular Disease

## 2012-02-27 ENCOUNTER — Ambulatory Visit (INDEPENDENT_AMBULATORY_CARE_PROVIDER_SITE_OTHER): Payer: Medicaid Other | Admitting: Cardiovascular Disease

## 2012-02-27 ENCOUNTER — Telehealth: Payer: Self-pay

## 2012-02-27 VITALS — BP 127/83 | HR 67 | Ht 70.0 in | Wt 220.5 lb

## 2012-02-27 DIAGNOSIS — I509 Heart failure, unspecified: Secondary | ICD-10-CM

## 2012-02-27 DIAGNOSIS — G8929 Other chronic pain: Secondary | ICD-10-CM | POA: Insufficient documentation

## 2012-02-27 DIAGNOSIS — F17200 Nicotine dependence, unspecified, uncomplicated: Secondary | ICD-10-CM

## 2012-02-27 DIAGNOSIS — I10 Essential (primary) hypertension: Secondary | ICD-10-CM

## 2012-02-27 DIAGNOSIS — I5032 Chronic diastolic (congestive) heart failure: Secondary | ICD-10-CM

## 2012-02-27 DIAGNOSIS — Z720 Tobacco use: Secondary | ICD-10-CM

## 2012-02-27 DIAGNOSIS — M549 Dorsalgia, unspecified: Secondary | ICD-10-CM

## 2012-02-27 DIAGNOSIS — I2510 Atherosclerotic heart disease of native coronary artery without angina pectoris: Secondary | ICD-10-CM

## 2012-02-27 MED ORDER — ENALAPRIL MALEATE 5 MG PO TABS: 5.0000 mg | ORAL_TABLET | Freq: Two times a day (BID) | ORAL | Status: DC

## 2012-02-27 MED ORDER — OXYCODONE-ACETAMINOPHEN 5-325 MG PO TABS: 1.0000 | ORAL_TABLET | Freq: Four times a day (QID) | ORAL | Status: DC | PRN

## 2012-02-27 NOTE — Assessment & Plan Note (Signed)
I again discussed with him the importance of smoking cessation. 

## 2012-02-27 NOTE — Assessment & Plan Note (Signed)
His blood pressure is now well controlled on current medications. His blood pressure is usually reactive in his back pain.

## 2012-02-27 NOTE — Assessment & Plan Note (Signed)
The patient is transferring care from [facility] to [place]. He is about to run out of Percocet. I agreed to give him a one-time supply of 60 tablets. I explained to him, but I cannot provide him with any further refills. I'm referring him to the pain clinic. He also wants to establish with a primary care physician here and he was referred.

## 2012-02-27 NOTE — Assessment & Plan Note (Signed)
He appears to be euvolemic off diuretics. 

## 2012-02-27 NOTE — Patient Instructions (Addendum)
Stop taking Potassium Chloride.  Refer to pain clinic (Dr. Metta Clines).  Refer to [facility] in [place].  Follow up in 3 months.

## 2012-02-27 NOTE — Telephone Encounter (Signed)
Attempted to call pt to tell him I was unable to call in [employer], that he has to take RX to pharmacy. This will be placed at [facility] for him to pick up.  Unable to L/M since "mailbox is full"

## 2012-02-27 NOTE — Progress Notes (Signed)
HPI  This is a 57 year old male who is here today for a followup visit regarding coronary artery disease and hypertension.  He underwent diagnostic catheterization in November of 2013 secondary to a non-STEMI following back surgery. He was found to have a subtotal occlusion of the right coronary artery with good left-to-right collaterals and was medically managed. He was readmitted December 30 secondary to atypical chest pain hypertension and diastolic CHF. He ruled out for MI and subsequently seen by cardiology and then discharged home. During his last visit, he was noted to be very hypertensive. The dose of Coreg was increased. He was taking both hydrochlorothiazide and Lasix. He was admitted briefly to [facility] for dizziness due to hypotension. He was found to be in mild acute renal failure with a creatinine of 1.5. He improved quickly with hydration. He was taken off diuretics. He has been doing well since then. He denies any chest pain or significant dyspnea. He is trying to quit smoking. He has chronic back pain with no significant improvement in symptoms after back surgery.  Allergies  Allergen Reactions  . Ibuprofen   . Tramadol      Current Outpatient Prescriptions on File Prior to Visit  Medication Sig Dispense Refill  . acetaminophen (TYLENOL) 500 MG tablet Take 1,000 mg by mouth every 6 (six) hours as needed. pain      . albuterol (PROVENTIL HFA;VENTOLIN HFA) 108 (90 BASE) MCG/ACT inhaler Inhale 2 puffs into the lungs every 6 (six) hours as needed. Wheezing and shortness of breath      . aspirin EC 81 MG tablet Take 81 mg by mouth once.      Marland Kitchen atorvastatin (LIPITOR) 40 MG tablet Take 40 mg by mouth daily.      . clopidogrel (PLAVIX) 75 MG tablet Take 75 mg by mouth daily.      . Fluticasone-Salmeterol (ADVAIR DISKUS) 250-50 MCG/DOSE AEPB Inhale 1 puff into the lungs every 12 (twelve) hours.      . gabapentin (NEURONTIN) 600 MG tablet Take 600 mg by mouth 3 (three) times daily.      Marland Kitchen  tiotropium (SPIRIVA) 18 MCG inhalation capsule Place 18 mcg into inhaler and inhale daily.         Past Medical History  Diagnosis Date  . Hypertension   . Degenerative joint disease of spine     a. 11/2011 s/p L4-L5 fusion.  . Chronic back pain   . Chronic diastolic CHF (congestive heart failure)   . COPD (chronic obstructive pulmonary disease)   . CAD (coronary artery disease)     a. 11/2011 NSTEMI/Cath: subtotal occlusion of the mid RCA with L->R collats, nl LV fxn-->medical Rx;  . Nephrolithiasis   . Tobacco abuse     a. 80+ pack year hx     Past Surgical History  Procedure Date  . Shoulder surgery   . Knee surgery   . Wrist surgery   . Nose surgery   . Carpal tunnel release   . Kidney stone     removed  . L4-l5 spine surgery 2013  . Cardiac catheterization 12/15/2011     History reviewed. No pertinent family history.   History   Social History  . Marital Status: Legally Separated    Spouse Name: N/A    Number of Children: N/A  . Years of Education: N/A   Occupational History  . Not on file.   Social History Main Topics  . Smoking status: Current Some Day Smoker --  0.5 packs/day for 42 years    Types: Cigarettes  . Smokeless tobacco: Not on file  . Alcohol Use: Yes     Comment: seldom  . Drug Use: Yes     Comment: past  . Sexually Active:    Other Topics Concern  . Not on file   Social History Narrative  . No narrative on file      PHYSICAL EXAM   BP 127/83  Pulse 67  Ht 5\' 10"  (1.778 m)  Wt 220 lb 8 oz (100.018 kg)  BMI 31.64 kg/m2 Constitutional: He is oriented to person, place, and time. He appears well-developed and well-nourished. No distress.  HENT: No nasal discharge.  Head: Normocephalic and atraumatic.  Eyes: Pupils are equal and round. Right eye exhibits no discharge. Left eye exhibits no discharge.  Neck: Normal range of motion. Neck supple. No JVD present. No thyromegaly present.  Cardiovascular: Normal rate, regular  rhythm, normal heart sounds and. Exam reveals no gallop and no friction rub. No murmur heard.  Pulmonary/Chest: Effort normal and breath sounds normal. No stridor. No respiratory distress. He has no wheezes. He has no rales. He exhibits no tenderness.  Abdominal: Soft. Bowel sounds are normal. He exhibits no distension. There is no tenderness. There is no rebound and no guarding.  Musculoskeletal: Normal range of motion. He exhibits no edema and no tenderness.  Neurological: He is alert and oriented to person, place, and time. Coordination normal.  Skin: Skin is warm and dry. No rash noted. He is not diaphoretic. No erythema. No pallor.  Psychiatric: He has a normal mood and affect. His behavior is normal. Judgment and thought content normal.       EKG: Sinus  Rhythm  - occasional PAC    # PACs = 1. WITHIN NORMAL LIMITS   ASSESSMENT AND PLAN

## 2012-02-27 NOTE — Assessment & Plan Note (Signed)
The patient is not having symptoms suggestive of angina at this time recommend medical therapy.

## 2012-03-01 ENCOUNTER — Telehealth: Payer: Self-pay | Admitting: *Deleted

## 2012-03-01 ENCOUNTER — Other Ambulatory Visit: Payer: Self-pay

## 2012-03-01 MED ORDER — BD ASSURE BPM/AUTO ARM CUFF MISC: 1.0000 | Freq: Every day | Status: AC

## 2012-03-01 NOTE — Telephone Encounter (Signed)
RX was picked up by pt this am

## 2012-03-01 NOTE — Telephone Encounter (Signed)
Pt wants to know if he can get a script for a BP cuff

## 2012-03-01 NOTE — Telephone Encounter (Signed)
RX for BP cuff given to pt

## 2012-03-22 ENCOUNTER — Other Ambulatory Visit: Payer: Self-pay | Admitting: *Deleted

## 2012-03-22 MED ORDER — CLOPIDOGREL BISULFATE 75 MG PO TABS: 75.0000 mg | ORAL_TABLET | Freq: Every day | ORAL | Status: DC

## 2012-03-22 NOTE — Telephone Encounter (Signed)
Refilled Plavix sent to [facility].

## 2012-03-24 ENCOUNTER — Emergency Department: Payer: Self-pay | Admitting: Emergency Medicine

## 2012-03-24 LAB — URINALYSIS, COMPLETE
Bacteria: NONE SEEN
Blood: NEGATIVE
Ketone: NEGATIVE
Leukocyte Esterase: NEGATIVE
Ph: 5 (ref 4.5–8.0)
Squamous Epithelial: NONE SEEN
WBC UR: <1 /HPF (ref 0–5)

## 2012-03-24 LAB — COMPREHENSIVE METABOLIC PANEL
Albumin: 3.4 g/dL (ref 3.4–5.0)
Alkaline Phosphatase: 114 U/L (ref 50–136)
Anion Gap: 7 (ref 7–16)
Bilirubin,Total: 0.2 mg/dL (ref 0.2–1.0)
Chloride: 105 mmol/L (ref 98–107)
Glucose: 125 mg/dL — ABNORMAL HIGH (ref 65–99)
Osmolality: 281 (ref 275–301)
SGOT(AST): 28 U/L (ref 15–37)
SGPT (ALT): 19 U/L (ref 12–78)
Sodium: 137 mmol/L (ref 136–145)
Total Protein: 7.2 g/dL (ref 6.4–8.2)

## 2012-03-24 LAB — CBC
HGB: 10.5 g/dL — ABNORMAL LOW (ref 13.0–18.0)
Platelet: 229 10*3/uL (ref 150–440)
RBC: 3.65 10*6/uL — ABNORMAL LOW (ref 4.40–5.90)
RDW: 15.7 % — ABNORMAL HIGH (ref 11.5–14.5)
WBC: 7.5 10*3/uL (ref 3.8–10.6)

## 2012-03-29 ENCOUNTER — Telehealth: Payer: Self-pay

## 2012-03-29 NOTE — Telephone Encounter (Signed)
Attempted to reach pt [id number] with pain clinic

## 2012-03-30 NOTE — Telephone Encounter (Signed)
Line busy (W)

## 2012-03-30 NOTE — Telephone Encounter (Signed)
lmtcb (M)

## 2012-03-31 NOTE — Telephone Encounter (Signed)
Refer to my last note. I said no refills will be provided.

## 2012-03-31 NOTE — Telephone Encounter (Signed)
I called Dr. Letta Moynahan office who says they will fax me another referral form ASAP I will complete this and fax back

## 2012-03-31 NOTE — Telephone Encounter (Signed)
Pt called back Says he has not gotten a response from Dr. Dierdre Harness clinic I told him I would call to check status of this and call him back I explained PCP in [place] does not accept Medicaid pts  He understands and is going to try the [facility] He asks if Dr. Kirke Corin would be willing to refill his percocet until he can be managed in pain clinic I explained Dr. Kirke Corin usually does not prescribe these meds but I would ask him

## 2012-04-01 NOTE — Telephone Encounter (Signed)
Referral form completed and faxed to Dr. Letta Moynahan office for pain clinic appt

## 2012-04-01 NOTE — Telephone Encounter (Signed)
Pt informed Understanding verb 

## 2012-05-07 ENCOUNTER — Emergency Department: Payer: Self-pay | Admitting: Emergency Medicine

## 2012-05-07 LAB — URINALYSIS, COMPLETE
Bilirubin,UR: NEGATIVE
Ketone: NEGATIVE
Leukocyte Esterase: NEGATIVE
Nitrite: NEGATIVE
RBC,UR: <1 /HPF (ref 0–5)
Squamous Epithelial: NONE SEEN
WBC UR: <1 /HPF (ref 0–5)

## 2012-05-07 LAB — COMPREHENSIVE METABOLIC PANEL
Alkaline Phosphatase: 115 U/L (ref 50–136)
Anion Gap: 5 — ABNORMAL LOW (ref 7–16)
BUN: 15 mg/dL (ref 7–18)
Calcium, Total: 8.7 mg/dL (ref 8.5–10.1)
Chloride: 99 mmol/L (ref 98–107)
Co2: 29 mmol/L (ref 21–32)
Creatinine: 0.89 mg/dL (ref 0.60–1.30)
Glucose: 113 mg/dL — ABNORMAL HIGH (ref 65–99)
Potassium: 3.7 mmol/L (ref 3.5–5.1)
SGPT (ALT): 14 U/L (ref 12–78)

## 2012-05-07 LAB — CBC
HCT: 29.5 % — ABNORMAL LOW (ref 40.0–52.0)
MCH: 27.7 pg (ref 26.0–34.0)
MCV: 83 fL (ref 80–100)
Platelet: 215 10*3/uL (ref 150–440)
RBC: 3.53 10*6/uL — ABNORMAL LOW (ref 4.40–5.90)
RDW: 15.3 % — ABNORMAL HIGH (ref 11.5–14.5)
WBC: 13.6 10*3/uL — ABNORMAL HIGH (ref 3.8–10.6)

## 2012-05-07 LAB — CK TOTAL AND CKMB (NOT AT ARMC)
CK, Total: 118 U/L (ref 35–232)
CK-MB: 2.4 ng/mL (ref 0.5–3.6)

## 2012-05-07 LAB — TROPONIN I: Troponin-I: <0.02 ng/mL

## 2012-05-17 ENCOUNTER — Ambulatory Visit (INDEPENDENT_AMBULATORY_CARE_PROVIDER_SITE_OTHER): Payer: Medicaid Other | Admitting: Cardiovascular Disease

## 2012-05-17 ENCOUNTER — Ambulatory Visit: Payer: Medicaid Other | Admitting: Cardiovascular Disease

## 2012-05-17 ENCOUNTER — Encounter: Payer: Self-pay | Admitting: Cardiovascular Disease

## 2012-05-17 VITALS — BP 154/89 | HR 73 | Ht 70.0 in | Wt 233.2 lb

## 2012-05-17 DIAGNOSIS — I2510 Atherosclerotic heart disease of native coronary artery without angina pectoris: Secondary | ICD-10-CM

## 2012-05-17 DIAGNOSIS — R079 Chest pain, unspecified: Secondary | ICD-10-CM

## 2012-05-17 DIAGNOSIS — I5032 Chronic diastolic (congestive) heart failure: Secondary | ICD-10-CM

## 2012-05-17 DIAGNOSIS — Z720 Tobacco use: Secondary | ICD-10-CM

## 2012-05-17 DIAGNOSIS — I509 Heart failure, unspecified: Secondary | ICD-10-CM

## 2012-05-17 DIAGNOSIS — F17200 Nicotine dependence, unspecified, uncomplicated: Secondary | ICD-10-CM

## 2012-05-17 DIAGNOSIS — M479 Spondylosis, unspecified: Secondary | ICD-10-CM

## 2012-05-17 DIAGNOSIS — R0602 Shortness of breath: Secondary | ICD-10-CM

## 2012-05-17 DIAGNOSIS — I10 Essential (primary) hypertension: Secondary | ICD-10-CM

## 2012-05-17 DIAGNOSIS — J449 Chronic obstructive pulmonary disease, unspecified: Secondary | ICD-10-CM

## 2012-05-17 NOTE — Assessment & Plan Note (Signed)
Mild exp wheezing on exam. ? Still resolving from bronchitis or A/COPD +/- bronchospasm from seasonal allergies. Advised to use albuterol PRN as prescribed. Continue Spiriva. Recommended OTC Zyrtec or Claritin for seasonal allergies.

## 2012-05-17 NOTE — Assessment & Plan Note (Addendum)
Stressed cessation. Attempting to quit. Offered NRT this visit, but declined. Will readdress on follow-up.

## 2012-05-17 NOTE — Patient Instructions (Addendum)
Please follow-up in 3 months.   Increase Coreg to 25mg  (two tablets) twice a day. Monitor blood pressure. Please call for any new symptoms including dizziness, lightheadedness. Limit salt intake.   Please follow-up with the pain clinic as scheduled for further pain management. In the meantime, Percocet 10-325mg  x 14 tablets will be provided.   Please try Mucinex to improve chest pain related to cough.   Try over the counter Zyrtec, Claritin for seasonal allergies.   Use albuterol, Spiriva as prescribed for COPD.

## 2012-05-17 NOTE — Assessment & Plan Note (Signed)
Mildly elevated in the office today. Have advised to increase Coreg to 25mg  BID. He has had dizziness with antihypertensives in the past. If this occurs with the adjustment, plan 12.5mg  AM, 25mg  PM. He will continue to monitor his BP.

## 2012-05-17 NOTE — Progress Notes (Signed)
Date:  05/17/2012   ID:  Walter [name], DOB 01-12-1956, MRN [id number]  PCP:  Pcp Not In System  Primary Cardiologist:  M. Kirke Corin, MD      History of Present Illness: Walter [name] is a 57 y.o. male who is seen for Dr. Kirke Corin today for follow-up. He has a history of CAD, chronic diastolic CHF, COPD, DDD s/p L4-L5 fusion, tobacco abuse and HTN.   He underwent diagnostic catheterization in November of 2013 secondary to a non-STEMI following back surgery. He was found to have a subtotal occlusion of the right coronary artery with good left-to-right collaterals and was medically managed. He was readmitted December 30 secondary to atypical chest pain hypertension and diastolic CHF. He ruled out for MI and subsequently seen by cardiology and then discharged home.  The last two follow-ups have been focused on controlled blood pressure. This had been limited due by episodic dizziness on diuretics and BB, but was noted to be better-controlled last visit.   He was seen at [facility] ED last week for acute bronchitis/COPD exacerbation manifested as new cough productive of yellow sputum and SOB. He was given antibiotics, SABA + Spiriva. He noted a cough associated with this. He reports R sided chest burning every time he coughs, takes in a deep breath or changes position. No exertional discomfort.   He has chronic low back pain, and notes persistent discomfort with occasional shooting pains into his legs and numbness on the bottom of his feet. Neurontin has helped. He has run out of Percocet for breakthrough pain. He has an appointment with the pain clinic next month.   He states his blood pressure at home has been running 150/80s. He continues to smoke cigarettes and is making an effort to quit.   BP today 154/89.   EKG- NSR, 73 bpm, nonspecific upsloping ST depressions II, aVF, downsloping ST dep isolated in III   Wt Readings from Last 3 Encounters:  05/17/12 233 lb 4 oz (105.802 kg)   02/27/12 220 lb 8 oz (100.018 kg)  02/04/12 226 lb 4 oz (102.626 kg)     Past Medical History  Diagnosis Date  . Hypertension   . Degenerative joint disease of spine     a. 11/2011 s/p L4-L5 fusion.  . Chronic back pain   . Chronic diastolic CHF (congestive heart failure)   . COPD (chronic obstructive pulmonary disease)   . CAD (coronary artery disease)     a. 11/2011 NSTEMI/Cath: subtotal occlusion of the mid RCA with L->R collats, nl LV fxn-->medical Rx;  . Nephrolithiasis   . Tobacco abuse     a. 80+ pack year hx  . Rupture of rotator cuff of shoulder     left    Current Outpatient Prescriptions  Medication Sig Dispense Refill  . acetaminophen (TYLENOL) 500 MG tablet Take 1,000 mg by mouth every 6 (six) hours as needed. pain      . albuterol (PROVENTIL HFA;VENTOLIN HFA) 108 (90 BASE) MCG/ACT inhaler Inhale 2 puffs into the lungs every 6 (six) hours as needed. Wheezing and shortness of breath      . aspirin EC 81 MG tablet Take 81 mg by mouth once.      Marland Kitchen atorvastatin (LIPITOR) 40 MG tablet Take 40 mg by mouth daily.      . Blood Pressure Monitoring (B-D ASSURE BPM/AUTO ARM CUFF) MISC 1 Device by Does not apply route daily.  1 each  0  . carvedilol (COREG) 25 MG  tablet Take 12.5 mg by mouth 2 (two) times daily.      . clopidogrel (PLAVIX) 75 MG tablet Take 1 tablet (75 mg total) by mouth daily.  30 tablet  5  . enalapril (VASOTEC) 5 MG tablet Take 1 tablet (5 mg total) by mouth 2 (two) times daily.  60 tablet  6  . Fluticasone-Salmeterol (ADVAIR DISKUS) 250-50 MCG/DOSE AEPB Inhale 1 puff into the lungs every 12 (twelve) hours.      . gabapentin (NEURONTIN) 800 MG tablet Take 800 mg by mouth 4 (four) times daily.      Marland Kitchen oxyCODONE-acetaminophen (PERCOCET) 5-325 MG per tablet Take 1 tablet by mouth every 6 (six) hours as needed for pain.  60 tablet  0  . tiotropium (SPIRIVA) 18 MCG inhalation capsule Place 18 mcg into inhaler and inhale daily.       No current  facility-administered medications for this visit.    Allergies:    Allergies  Allergen Reactions  . Ibuprofen   . Tramadol     Social History:  The patient  reports that he has been smoking Cigarettes.  He has a 21 pack-year smoking history. He does not have any smokeless tobacco history on file. He reports that  drinks alcohol. He reports that he uses illicit drugs.   ROS:  Please see the history of present illness.   All other systems reviewed and negative.   PHYSICAL EXAM: VS:  BP 154/89  Pulse 73  Ht 5\' 10"  (1.778 m)  Wt 233 lb 4 oz (105.802 kg)  BMI 33.47 kg/m2 Well nourished, well developed, in no acute distress HEENT: normal Neck: no JVD Cardiac:  normal S1, S2; RRR; no murmur Lungs:  Trace exp wheezing, no rales or rhonchi Abd: soft, nontender, no hepatomegaly Ext: no edema Skin: warm and dry Neuro:  CNs 2-12 intact, no focal abnormalities noted  EKG:  EKG- NSR, 73 bpm, nonspecific upsloping ST depressions II, aVF, downsloping ST dep isolated in III

## 2012-05-17 NOTE — Assessment & Plan Note (Signed)
Lungs clear, euvolemic on exam. Stable.

## 2012-05-17 NOTE — Assessment & Plan Note (Signed)
Doing well. No exertional chest pain or dyspnea. No ischemic changes on EKG. Suspect chest discomfort described today more pleuritic due to persistent cough from bronchitis. Advised to try OTC Mucinex. Claritin or Zyrtec for seasonal allergies.

## 2012-05-17 NOTE — Assessment & Plan Note (Signed)
Has an appointment with the pain clinic next month. Have provided with 14 tablets of Percocet until this can be managed by the pain clinic.

## 2012-05-27 ENCOUNTER — Telehealth: Payer: Self-pay | Admitting: *Deleted

## 2012-05-27 NOTE — Telephone Encounter (Signed)
Pt calling states that his BP is dropping. States that his BP meds were doubled at last visit

## 2012-05-28 ENCOUNTER — Other Ambulatory Visit: Payer: Self-pay

## 2012-05-28 MED ORDER — CARVEDILOL 12.5 MG PO TABS: 12.5000 mg | ORAL_TABLET | Freq: Two times a day (BID) | ORAL | Status: DC

## 2012-05-28 NOTE — Telephone Encounter (Signed)
Pt says when he was here last OV coreg was increased to 25 mg BID for elevated BP He says he tried this and BP dropped to 80/54, 90/53, 87/53 He decreased back to 12.5 mg BID and BP is better. Today it is 123/73 Just wanted to make [place] aware he has resumed original dose I will make Dr. Kirke Corin aware

## 2012-05-31 NOTE — Telephone Encounter (Signed)
Ok. That is fine. Can continue 12.5 mg bid.

## 2012-06-17 ENCOUNTER — Ambulatory Visit: Payer: Self-pay | Admitting: Pain Medicine

## 2012-06-22 ENCOUNTER — Telehealth: Payer: Self-pay | Admitting: *Deleted

## 2012-06-22 NOTE — Telephone Encounter (Signed)
Patient says he is on lasix and needs a refill [facility].  I do not see this med on his list.

## 2012-06-23 NOTE — Telephone Encounter (Signed)
LMOM TCB regarding fluid pill.

## 2012-06-23 NOTE — Telephone Encounter (Signed)
Tried to contact patient but no answering machine to leave a message.

## 2012-06-24 ENCOUNTER — Other Ambulatory Visit: Payer: Self-pay

## 2012-06-24 DIAGNOSIS — R0602 Shortness of breath: Secondary | ICD-10-CM

## 2012-06-24 MED ORDER — FUROSEMIDE 20 MG PO TABS: 20.0000 mg | ORAL_TABLET | Freq: Every day | ORAL | Status: DC

## 2012-06-24 NOTE — Telephone Encounter (Signed)
Patient states was on Lasix 40 mg take one tablet daily prn when he was discharged from the hospital, he is out of the fluid pill however I do not see Lasix on his medication list. Patient started having swelling in legs, feet and ankles last Friday with shortness of breath, cough with coughing up phlegm.  Patient is out of Lasix and would like to know if should be seen. Patient weighed 235 lb at PCP on Monday, he usually weighs around 220 - 223 lb.  He was at [employer] and saw [employer], [place].  He also states since out of fluid pill and has no refills left, has been taken a friends Lasix which is the same dose that he was taking when discharged from the hospital which was Lasix 40 mg. Please advise what to do?

## 2012-06-24 NOTE — Telephone Encounter (Signed)
Pt says he was taking furosemide 40 mg daily at one point. He was then admitted to [facility] for dehydration and this was stopped He says when he last saw Dr. Kirke Corin he advised pt to take lasix 40 mg only PRN edema or worsening sob  Pt states his weight is up to 235 pounds (baseline=220 pounds), says he has noticed some worsening sob, productive cough producing yellow phlegm.  Denies orthopnea or worsening LE edema In no acute distress  Went to PCP and saw PA 5/2 for the sob and productive cough, was prescribed a breathing tx to use (nebulizer), says this has helped some  He has taken lasix 40 mg daily x 3 days. Last dose was 2 days ago because he ran out of med and had 0 refills  Due to see PCP again Monday for labs, etc  Before I make any changes to his lasix, etc I will discuss with Dr. Kirke Corin and see if pt needs to be seen by [place], PCP, or make changes via phone. I will call him back Understanding verb

## 2012-06-24 NOTE — Telephone Encounter (Signed)
Pt informed Understanding verb RX called to pharmacy  Pt asks if ok to take 2 tabs today since he has had none and he is starting to swell I advised ok to take 40 mg today of lasix then take 20 mg daily until we can check BMP  I advised him to call us prior to lab visit if symptoms get worse/go unchanged Understanding verb

## 2012-06-24 NOTE — Telephone Encounter (Signed)
Let's try Lasix at a smaller dose of 20 mg once daily. Check BMP in 1 week.

## 2012-07-01 ENCOUNTER — Ambulatory Visit (INDEPENDENT_AMBULATORY_CARE_PROVIDER_SITE_OTHER): Payer: Medicaid Other

## 2012-07-01 DIAGNOSIS — R0602 Shortness of breath: Secondary | ICD-10-CM

## 2012-07-02 ENCOUNTER — Telehealth: Payer: Self-pay

## 2012-07-02 LAB — BASIC METABOLIC PANEL
BUN/Creatinine Ratio: 27 — ABNORMAL HIGH (ref 9–20)
BUN: 26 mg/dL — ABNORMAL HIGH (ref 6–24)
CO2: 25 mmol/L (ref 19–28)
Calcium: 9.5 mg/dL (ref 8.7–10.2)
Chloride: 102 mmol/L (ref 97–108)
Sodium: 142 mmol/L (ref 134–144)

## 2012-07-05 ENCOUNTER — Other Ambulatory Visit: Payer: Self-pay

## 2012-07-05 MED ORDER — FUROSEMIDE 20 MG PO TABS: 20.0000 mg | ORAL_TABLET | ORAL | Status: DC | PRN

## 2012-07-08 NOTE — Telephone Encounter (Signed)
error 

## 2012-07-11 ENCOUNTER — Emergency Department: Payer: Self-pay | Admitting: Internal Medicine

## 2012-07-11 LAB — CBC
HCT: 31.1 % — ABNORMAL LOW (ref 40.0–52.0)
HGB: 10.4 g/dL — ABNORMAL LOW (ref 13.0–18.0)
MCH: 26.1 pg (ref 26.0–34.0)
MCV: 78 fL — ABNORMAL LOW (ref 80–100)
RBC: 3.97 10*6/uL — ABNORMAL LOW (ref 4.40–5.90)
WBC: 6.2 10*3/uL (ref 3.8–10.6)

## 2012-07-11 LAB — TROPONIN I: Troponin-I: <0.02 ng/mL

## 2012-07-11 LAB — PRO B NATRIURETIC PEPTIDE: B-Type Natriuretic Peptide: 259 pg/mL — ABNORMAL HIGH (ref 0–125)

## 2012-07-11 LAB — COMPREHENSIVE METABOLIC PANEL
Anion Gap: 4 — ABNORMAL LOW (ref 7–16)
BUN: 14 mg/dL (ref 7–18)
Bilirubin,Total: 0.3 mg/dL (ref 0.2–1.0)
Calcium, Total: 8.5 mg/dL (ref 8.5–10.1)
Chloride: 106 mmol/L (ref 98–107)
Creatinine: 0.71 mg/dL (ref 0.60–1.30)
Glucose: 97 mg/dL (ref 65–99)
Osmolality: 276 (ref 275–301)
Potassium: 4.2 mmol/L (ref 3.5–5.1)
SGOT(AST): 21 U/L (ref 15–37)
SGPT (ALT): 16 U/L (ref 12–78)
Sodium: 138 mmol/L (ref 136–145)
Total Protein: 7.5 g/dL (ref 6.4–8.2)

## 2012-07-28 ENCOUNTER — Telehealth: Payer: Self-pay

## 2012-07-28 ENCOUNTER — Emergency Department: Payer: Self-pay | Admitting: Emergency Medicine

## 2012-07-28 LAB — CBC
HGB: 8.7 g/dL — ABNORMAL LOW (ref 13.0–18.0)
MCHC: 32.3 g/dL (ref 32.0–36.0)
Platelet: 189 10*3/uL (ref 150–440)
RBC: 3.37 10*6/uL — ABNORMAL LOW (ref 4.40–5.90)
RDW: 15.8 % — ABNORMAL HIGH (ref 11.5–14.5)

## 2012-07-28 LAB — COMPREHENSIVE METABOLIC PANEL
Albumin: 3.3 g/dL — ABNORMAL LOW (ref 3.4–5.0)
Alkaline Phosphatase: 130 U/L (ref 50–136)
Bilirubin,Total: 0.2 mg/dL (ref 0.2–1.0)
Chloride: 108 mmol/L — ABNORMAL HIGH (ref 98–107)
Co2: 30 mmol/L (ref 21–32)
EGFR (Non-African Amer.): >60
Osmolality: 285 (ref 275–301)
SGOT(AST): 24 U/L (ref 15–37)
SGPT (ALT): 17 U/L (ref 12–78)
Total Protein: 7 g/dL (ref 6.4–8.2)

## 2012-07-28 LAB — HEMOGLOBIN: HGB: 8.9 g/dL — ABNORMAL LOW (ref 13.0–18.0)

## 2012-07-28 LAB — TROPONIN I
Troponin-I: <0.02 ng/mL
Troponin-I: <0.02 ng/mL

## 2012-07-28 LAB — CK TOTAL AND CKMB (NOT AT ARMC): CK, Total: 162 U/L (ref 35–232)

## 2012-07-28 NOTE — Telephone Encounter (Signed)
Pt reports low BP this am 87/58 associated with chest heaviness and dizziness He has appt with Dr. Kirke Corin 7/21 Also c/o diarrhea, denies blood in stool Confirms he has been drinking plenty of fluid Has taken BP meds this am Because he is having active chest pressure I advised he go to ER since Dr. Kirke Corin not in office Understanding verb

## 2012-07-28 NOTE — Telephone Encounter (Signed)
Ok

## 2012-07-29 ENCOUNTER — Telehealth: Payer: Self-pay

## 2012-07-29 NOTE — Telephone Encounter (Signed)
Pt says he went to ED yesterday for low BP and chest tightness R/O for MI He skipped lisinopril this am and BP is 117/51 He will continue to monitor BP over the w/e and will hold enalapril if BP continues to run low He has appt with Dr. Kirke Corin 7/21 but would like sooner appt

## 2012-07-29 NOTE — Telephone Encounter (Signed)
Pt states he is having trouble with his BP, states he went to ED yesterday BP dropped 86/54. States dr Kirke Corin was not in the office yesterday, dr Mariah Milling wanted him to call today. Please call. BP today 117/68 Skipped Enalopril today

## 2012-07-29 NOTE — Telephone Encounter (Signed)
Made appt for 7/7 at 3:15

## 2012-08-02 ENCOUNTER — Ambulatory Visit (INDEPENDENT_AMBULATORY_CARE_PROVIDER_SITE_OTHER): Payer: Medicaid Other | Admitting: Cardiovascular Disease

## 2012-08-02 ENCOUNTER — Encounter: Payer: Self-pay | Admitting: Cardiovascular Disease

## 2012-08-02 VITALS — BP 142/84 | HR 67 | Ht 70.0 in | Wt 232.5 lb

## 2012-08-02 DIAGNOSIS — I5032 Chronic diastolic (congestive) heart failure: Secondary | ICD-10-CM

## 2012-08-02 DIAGNOSIS — I509 Heart failure, unspecified: Secondary | ICD-10-CM

## 2012-08-02 DIAGNOSIS — I10 Essential (primary) hypertension: Secondary | ICD-10-CM

## 2012-08-02 DIAGNOSIS — E785 Hyperlipidemia, unspecified: Secondary | ICD-10-CM

## 2012-08-02 DIAGNOSIS — F17200 Nicotine dependence, unspecified, uncomplicated: Secondary | ICD-10-CM

## 2012-08-02 DIAGNOSIS — I2510 Atherosclerotic heart disease of native coronary artery without angina pectoris: Secondary | ICD-10-CM

## 2012-08-02 DIAGNOSIS — Z720 Tobacco use: Secondary | ICD-10-CM

## 2012-08-02 DIAGNOSIS — I959 Hypotension, unspecified: Secondary | ICD-10-CM

## 2012-08-02 NOTE — Progress Notes (Signed)
HPI  This is a 57 year old male who is here today for a followup visit regarding coronary artery disease and hypertension.  He underwent diagnostic catheterization in November of 2013 secondary to a non-STEMI following back surgery. He was found to have a subtotal occlusion of the right coronary artery with good left-to-right collaterals and was medically managed. He was readmitted December 30 secondary to atypical chest pain hypertension and diastolic CHF. He ruled out for MI.  He continues to have problems with labile hypertension. Lastly, he felt dizzy and lightheaded with hypotension. He had some chest discomfort as well. He was sent to the emergency room at [facility] where his cardiac enzymes were negative. His EKG was unremarkable. His BUN was slightly elevated at 27 with a creatinine of 1.01. BNP was 296. He was instructed to taken out of the once daily instead of twice daily. His blood pressure has been reasonably controlled since then with no episodes of hypotension. He continues to take carvedilol 12.5 mg twice daily.  Allergies  Allergen Reactions  . Ibuprofen   . Tramadol      Current Outpatient Prescriptions on File Prior to Visit  Medication Sig Dispense Refill  . acetaminophen (TYLENOL) 500 MG tablet Take 1,000 mg by mouth every 6 (six) hours as needed. pain      . albuterol (PROVENTIL HFA;VENTOLIN HFA) 108 (90 BASE) MCG/ACT inhaler Inhale 2 puffs into the lungs every 6 (six) hours as needed. Wheezing and shortness of breath      . aspirin EC 81 MG tablet Take 81 mg by mouth once.      Marland Kitchen atorvastatin (LIPITOR) 40 MG tablet Take 40 mg by mouth daily.      . Blood Pressure Monitoring (B-D ASSURE BPM/AUTO ARM CUFF) MISC 1 Device by Does not apply route daily.  1 each  0  . carvedilol (COREG) 12.5 MG tablet Take 1 tablet (12.5 mg total) by mouth 2 (two) times daily.  180 tablet  3  . clopidogrel (PLAVIX) 75 MG tablet Take 1 tablet (75 mg total) by mouth daily.  30 tablet  5  .  Fluticasone-Salmeterol (ADVAIR DISKUS) 250-50 MCG/DOSE AEPB Inhale 1 puff into the lungs every 12 (twelve) hours.      . furosemide (LASIX) 20 MG tablet Take 1 tablet (20 mg total) by mouth as needed.  90 tablet  3  . gabapentin (NEURONTIN) 800 MG tablet Take 800 mg by mouth 4 (four) times daily.      Marland Kitchen tiotropium (SPIRIVA) 18 MCG inhalation capsule Place 18 mcg into inhaler and inhale daily.       No current facility-administered medications on file prior to visit.     Past Medical History  Diagnosis Date  . Hypertension   . Degenerative joint disease of spine     a. 11/2011 s/p L4-L5 fusion.  . Chronic back pain   . Chronic diastolic CHF (congestive heart failure)   . COPD (chronic obstructive pulmonary disease)   . CAD (coronary artery disease)     a. 11/2011 NSTEMI/Cath: subtotal occlusion of the mid RCA with L->R collats, nl LV fxn-->medical Rx;  . Nephrolithiasis   . Tobacco abuse     a. 80+ pack year hx  . Rupture of rotator cuff of shoulder     left     Past Surgical History  Procedure Laterality Date  . Shoulder surgery    . Knee surgery    . Wrist surgery    . Nose surgery    .  Carpal tunnel release    . Kidney stone      removed  . L4-l5 spine surgery  2013  . Cardiac catheterization  12/15/2011     History reviewed. No pertinent family history.   History   Social History  . Marital Status: Legally Separated    Spouse Name: N/A    Number of Children: N/A  . Years of Education: N/A   Occupational History  . Not on file.   Social History Main Topics  . Smoking status: Current Some Day Smoker -- 0.50 packs/day for 42 years    Types: Cigarettes  . Smokeless tobacco: Not on file  . Alcohol Use: Yes     Comment: seldom  . Drug Use: Yes     Comment: past  . Sexually Active:    Other Topics Concern  . Not on file   Social History Narrative  . No narrative on file      PHYSICAL EXAM   BP 142/84  Pulse 67  Ht 5\' 10"  (1.778 m)  Wt 232 lb  8 oz (105.461 kg)  BMI 33.36 kg/m2 Constitutional: He is oriented to person, place, and time. He appears well-developed and well-nourished. No distress.  HENT: No nasal discharge.  Head: Normocephalic and atraumatic.  Eyes: Pupils are equal and round. Right eye exhibits no discharge. Left eye exhibits no discharge.  Neck: Normal range of motion. Neck supple. No JVD present. No thyromegaly present.  Cardiovascular: Normal rate, regular rhythm, normal heart sounds and. Exam reveals no gallop and no friction rub. No murmur heard.  Pulmonary/Chest: Effort normal and breath sounds normal. No stridor. No respiratory distress. He has no wheezes. He has no rales. He exhibits no tenderness.  Abdominal: Soft. Bowel sounds are normal. He exhibits no distension. There is no tenderness. There is no rebound and no guarding.  Musculoskeletal: Normal range of motion. He exhibits no edema and no tenderness.  Neurological: He is alert and oriented to person, place, and time. Coordination normal.  Skin: Skin is warm and dry. No rash noted. He is not diaphoretic. No erythema. No pallor.  Psychiatric: He has a normal mood and affect. His behavior is normal. Judgment and thought content normal.       HYQ:MVHQI  Rhythm  WITHIN NORMAL LIMITS    ASSESSMENT AND PLAN

## 2012-08-02 NOTE — Assessment & Plan Note (Signed)
I discussed with him the importance of smoking cessation but he is not able to quit currently.

## 2012-08-02 NOTE — Assessment & Plan Note (Signed)
He appears to be euvolemic. He did have worsening renal function while he was on maintenance dose of furosemide which is currently being used only as needed.

## 2012-08-02 NOTE — Assessment & Plan Note (Signed)
His blood pressure is reasonably controlled now.

## 2012-08-02 NOTE — Patient Instructions (Addendum)
Labs today.  Continue same medications.  Follow up in 6 months.

## 2012-08-02 NOTE — Assessment & Plan Note (Signed)
He is doing reasonably well overall. He had chest pain in the setting of hypotension. Otherwise he has no convincing symptoms of angina. The plan is to treat him with Plavix for one year for myocardial infarction. This can be stopped after November of 2014.

## 2012-08-03 ENCOUNTER — Other Ambulatory Visit: Payer: Self-pay

## 2012-08-03 LAB — HEPATIC FUNCTION PANEL
ALT: 7 IU/L (ref 0–44)
AST: 19 IU/L (ref 0–40)
Alkaline Phosphatase: 102 IU/L (ref 39–117)
Bilirubin, Direct: 0.08 mg/dL (ref 0.00–0.40)
Total Bilirubin: 0.2 mg/dL (ref 0.0–1.2)

## 2012-08-03 LAB — LIPID PANEL: VLDL Cholesterol Cal: 51 mg/dL — ABNORMAL HIGH (ref 5–40)

## 2012-08-03 MED ORDER — FISH OIL 300 MG PO CAPS: 1.0000 | ORAL_CAPSULE | Freq: Every day | ORAL | Status: DC

## 2012-08-16 ENCOUNTER — Ambulatory Visit: Payer: Medicaid Other | Admitting: Cardiovascular Disease

## 2012-08-18 ENCOUNTER — Telehealth: Payer: Self-pay | Admitting: *Deleted

## 2012-08-18 DIAGNOSIS — I5032 Chronic diastolic (congestive) heart failure: Secondary | ICD-10-CM

## 2012-08-18 NOTE — Telephone Encounter (Signed)
Pt calls today b/c of 10 pound weight Saturday & last night as well as increased swelling in abdomen feet & legs, & pressure from the fluid as per pt. Pt states he took 2 of his furosemide ( 40mg  total) Saturday morning.  Weight Saturday: 240              Sunday :  230  After 40mg  of lasix on Saturday              Monday:   230             Tuesday:  230         Wednesday: 240  Has taken 40mg  of lasix today  BP  113/72  Hr 108  Denies any angina or chest pain. Does have DOE ambulating up to 100 ft ( states he has COPD) States he has been watching his diet as well    Will forward to Dr. Adele Schilder RN

## 2012-08-18 NOTE — Telephone Encounter (Signed)
Patient having problems with fluid building up in his abd and chest. Has gained 10 lbs. Please advise

## 2012-08-18 NOTE — Telephone Encounter (Signed)
Check BMP this week. He tends to get dehydrated quickly. IF his kidney function is stable, I might put him on lasix 20 mg once daily instead of PRN.

## 2012-08-18 NOTE — Telephone Encounter (Signed)
Pt aware of Dr. Jari Sportsman recommendations. Will come in tomorrow morning for BMP with current BP & weight Mylo Red RN

## 2012-08-19 ENCOUNTER — Ambulatory Visit (INDEPENDENT_AMBULATORY_CARE_PROVIDER_SITE_OTHER): Payer: Medicaid Other

## 2012-08-19 DIAGNOSIS — I5032 Chronic diastolic (congestive) heart failure: Secondary | ICD-10-CM

## 2012-08-19 NOTE — Telephone Encounter (Signed)
Pt comes in today for lab work. He has brought current bp & weight for this am.  Before am meds:  BP 198/113   HR  80     After    meds          BP 181/108   HR 78                               BP  135/81     HR 99  Weight today: 241  Dr. Kirke Corin has reviewed. Pt will continue taking furosemide 40mg  daily. Pt will call back with daily weights. Will call lab results to pt Mylo Red RN

## 2012-08-20 LAB — BASIC METABOLIC PANEL
BUN: 23 mg/dL (ref 6–24)
Chloride: 102 mmol/L (ref 97–108)
Creatinine, Ser: 1.21 mg/dL (ref 0.76–1.27)
GFR calc Af Amer: 77 mL/min/{1.73_m2} (ref >59)
Glucose: 119 mg/dL — ABNORMAL HIGH (ref 65–99)

## 2012-08-24 ENCOUNTER — Telehealth: Payer: Self-pay

## 2012-08-24 NOTE — Telephone Encounter (Signed)
I spoke with the patient. He states that his weight on Friday was 241 lbs. Monday he was 228 lbs and today he is 226 lbs. He thinks his baseline weight is around 220 lbs. He was instructed Friday to increase lasix to 40 mg x 2 days for edema. He states he took on 40 mg x 1 day and started to lose a lot of water weight. He did not take lasix today as he is feeling very weak. No BP readings since before Friday. I have instructed the patient to hold lasix x 2 days, assess his weight & blood pressure, and call us back Friday with his symptoms and weight. He verbalizes understanding.

## 2012-08-24 NOTE — Telephone Encounter (Signed)
Pt states he has lost weight ...current weight is 226. States he is very weak

## 2012-08-26 ENCOUNTER — Telehealth: Payer: Self-pay | Admitting: *Deleted

## 2012-08-26 NOTE — Telephone Encounter (Signed)
See telephone note from 08/24/12.

## 2012-08-26 NOTE — Telephone Encounter (Signed)
Advised of Dr Isabel Caprice recommendations to hold Lasix for now, hold Coreg and Enalapril x 1 day then resume and advised to have pt call back tomorrow for appt with Dr Kirke Corin next week.  Pt's family member verbalized understanding.  Will await pt call back tomorrow to schedule OV with Dr Kirke Corin next week.

## 2012-08-26 NOTE — Telephone Encounter (Signed)
Hold Lasix for now. Hold BP medications for 1 day then resume. Schedule him to see me next week to see what is going on with him.

## 2012-08-26 NOTE — Telephone Encounter (Signed)
Patient went to see PCP Dr. Imelda Pillow at [facility] today and was advised to call our office. His bp is 90/58, weight 234.  Held Lasix on 7/29 and 7/30.  Hasn't taken any Lasix today.  Pt states he is still very weak and tired. Pt is taking Coreg 12.5 BID and Enalapril 5mg  QD.  Primary MD did not change pt's medication regimen deferred to Dr Kirke Corin.  Please advise.

## 2012-08-26 NOTE — Telephone Encounter (Signed)
Patient went to see PCP Dr. Imelda Pillow at [facility] and was advised to call our office. His bp is 90/58.

## 2012-08-27 NOTE — Telephone Encounter (Signed)
Appt scheduled with Dr Kirke Corin for 09/02/12.

## 2012-09-02 ENCOUNTER — Ambulatory Visit: Payer: Medicaid Other | Admitting: Cardiovascular Disease

## 2012-09-16 ENCOUNTER — Emergency Department: Payer: Self-pay | Admitting: Emergency Medicine

## 2012-09-17 ENCOUNTER — Encounter: Payer: Self-pay | Admitting: Cardiovascular Disease

## 2012-09-17 ENCOUNTER — Ambulatory Visit (INDEPENDENT_AMBULATORY_CARE_PROVIDER_SITE_OTHER): Payer: Medicaid Other | Admitting: Cardiovascular Disease

## 2012-09-17 ENCOUNTER — Other Ambulatory Visit: Payer: Self-pay | Admitting: *Deleted

## 2012-09-17 VITALS — BP 130/78 | HR 88 | Ht 70.0 in | Wt 237.1 lb

## 2012-09-17 DIAGNOSIS — I509 Heart failure, unspecified: Secondary | ICD-10-CM

## 2012-09-17 DIAGNOSIS — I10 Essential (primary) hypertension: Secondary | ICD-10-CM

## 2012-09-17 DIAGNOSIS — I5032 Chronic diastolic (congestive) heart failure: Secondary | ICD-10-CM

## 2012-09-17 DIAGNOSIS — I2510 Atherosclerotic heart disease of native coronary artery without angina pectoris: Secondary | ICD-10-CM

## 2012-09-17 MED ORDER — CLOPIDOGREL BISULFATE 75 MG PO TABS: 75.0000 mg | ORAL_TABLET | Freq: Every day | ORAL | Status: DC

## 2012-09-17 NOTE — Patient Instructions (Addendum)
Take Lasix 20 mg once daily. Do not take if your weight goes below 230 lbs.  Continue other medications.  Follow up in 3 months.

## 2012-09-17 NOTE — Progress Notes (Signed)
HPI  This is a 57 year old male who is here today for a followup visit regarding coronary artery disease, chronic diastolic heart failure and labile hypertension.  He underwent diagnostic catheterization in November of 2013 secondary to a non-STEMI following back surgery. He was found to have a subtotal occlusion of the right coronary artery with good left-to-right collaterals and was medically managed. He was readmitted December 30 secondary to atypical chest pain hypertension and diastolic CHF. He ruled out for MI.  He continues to have problems with labile hypertension. He suffers from recurrent lower extremity edema which usually responds to Lasix but he tends to become volume depleted quickly.   Allergies  Allergen Reactions  . Ibuprofen   . Tramadol      Current Outpatient Prescriptions on File Prior to Visit  Medication Sig Dispense Refill  . acetaminophen (TYLENOL) 500 MG tablet Take 1,000 mg by mouth every 6 (six) hours as needed. pain      . albuterol (PROVENTIL HFA;VENTOLIN HFA) 108 (90 BASE) MCG/ACT inhaler Inhale 2 puffs into the lungs every 6 (six) hours as needed. Wheezing and shortness of breath      . aspirin EC 81 MG tablet Take 81 mg by mouth once.      Marland Kitchen atorvastatin (LIPITOR) 40 MG tablet Take 40 mg by mouth daily.      . Blood Pressure Monitoring (B-D ASSURE BPM/AUTO ARM CUFF) MISC 1 Device by Does not apply route daily.  1 each  0  . carvedilol (COREG) 12.5 MG tablet Take 1 tablet (12.5 mg total) by mouth 2 (two) times daily.  180 tablet  3  . clopidogrel (PLAVIX) 75 MG tablet Take 1 tablet (75 mg total) by mouth daily.  30 tablet  5  . enalapril (VASOTEC) 5 MG tablet Take 5 mg by mouth daily.      . Fluticasone-Salmeterol (ADVAIR DISKUS) 250-50 MCG/DOSE AEPB Inhale 1 puff into the lungs every 12 (twelve) hours.      . furosemide (LASIX) 20 MG tablet Take 1 tablet (20 mg total) by mouth as needed.  90 tablet  3  . gabapentin (NEURONTIN) 800 MG tablet Take 800 mg by  mouth 4 (four) times daily.      . Omega-3 Fatty Acids (FISH OIL) 300 MG CAPS Take 1 capsule (300 mg total) by mouth daily.  30 capsule  0  . oxyCODONE-acetaminophen (PERCOCET) 10-325 MG per tablet Take 1 tablet by mouth as needed for pain.      Marland Kitchen tiotropium (SPIRIVA) 18 MCG inhalation capsule Place 18 mcg into inhaler and inhale daily.       No current facility-administered medications on file prior to visit.     Past Medical History  Diagnosis Date  . Hypertension   . Degenerative joint disease of spine     a. 11/2011 s/p L4-L5 fusion.  . Chronic back pain   . Chronic diastolic CHF (congestive heart failure)   . COPD (chronic obstructive pulmonary disease)   . CAD (coronary artery disease)     a. 11/2011 NSTEMI/Cath: subtotal occlusion of the mid RCA with L->R collats, nl LV fxn-->medical Rx;  . Nephrolithiasis   . Tobacco abuse     a. 80+ pack year hx  . Rupture of rotator cuff of shoulder     left     Past Surgical History  Procedure Laterality Date  . Shoulder surgery    . Knee surgery    . Wrist surgery    .  Nose surgery    . Carpal tunnel release    . Kidney stone      removed  . L4-l5 spine surgery  2013  . Cardiac catheterization  12/15/2011     No family history on file.   History   Social History  . Marital Status: Legally Separated    Spouse Name: N/A    Number of Children: N/A  . Years of Education: N/A   Occupational History  . Not on file.   Social History Main Topics  . Smoking status: Current Some Day Smoker -- 0.50 packs/day for 42 years    Types: Cigarettes  . Smokeless tobacco: Not on file  . Alcohol Use: Yes     Comment: seldom  . Drug Use: Yes     Comment: past  . Sexual Activity:    Other Topics Concern  . Not on file   Social History Narrative  . No narrative on file      PHYSICAL EXAM   There were no vitals taken for this visit. Constitutional: He is oriented to person, place, and time. He appears well-developed  and well-nourished. No distress.  HENT: No nasal discharge.  Head: Normocephalic and atraumatic.  Eyes: Pupils are equal and round. Right eye exhibits no discharge. Left eye exhibits no discharge.  Neck: Normal range of motion. Neck supple. No JVD present. No thyromegaly present.  Cardiovascular: Normal rate, regular rhythm, normal heart sounds and. Exam reveals no gallop and no friction rub. No murmur heard.  Pulmonary/Chest: Effort normal and breath sounds normal. No stridor. No respiratory distress. He has no wheezes. He has no rales. He exhibits no tenderness.  Abdominal: Soft. Bowel sounds are normal. He exhibits no distension. There is no tenderness. There is no rebound and no guarding.  Musculoskeletal: Normal range of motion. He exhibits +1 edema and no tenderness.  Neurological: He is alert and oriented to person, place, and time. Coordination normal.  Skin: Skin is warm and dry. No rash noted. He is not diaphoretic. No erythema. No pallor.  Psychiatric: He has a normal mood and affect. His behavior is normal. Judgment and thought content normal.         ASSESSMENT AND PLAN

## 2012-09-17 NOTE — Telephone Encounter (Signed)
Refilled Plavix sent to [facility].

## 2012-09-19 ENCOUNTER — Encounter: Payer: Self-pay | Admitting: Cardiovascular Disease

## 2012-09-19 NOTE — Assessment & Plan Note (Signed)
He is stable with no symptoms of angina. Continue current medications.

## 2012-09-19 NOTE — Assessment & Plan Note (Signed)
He is mildly fluid overloaded. I advised him to use 20 mg of Lasix once daily. Given his previous history of volume depletion, I instructed him to hold this if his weight goes below 230 which seems to be his dry weight.

## 2012-09-19 NOTE — Assessment & Plan Note (Signed)
Blood pressure is now controlled on current medications. He tends to become hypotensive when his overdiuresed.

## 2012-10-08 ENCOUNTER — Ambulatory Visit: Payer: Self-pay | Admitting: Internal Medicine

## 2012-10-08 LAB — CBC CANCER CENTER
Eosinophil: 4 %
HCT: 31.8 % — ABNORMAL LOW (ref 40.0–52.0)
Lymphocytes: 18 %
MCH: 23.3 pg — ABNORMAL LOW (ref 26.0–34.0)
MCHC: 31.6 g/dL — ABNORMAL LOW (ref 32.0–36.0)
MCV: 74 fL — ABNORMAL LOW (ref 80–100)
Platelet: 243 x10 3/mm (ref 150–440)
RDW: 16.7 % — ABNORMAL HIGH (ref 11.5–14.5)
WBC: 9.1 x10 3/mm (ref 3.8–10.6)

## 2012-10-08 LAB — LACTATE DEHYDROGENASE: LDH: 152 U/L (ref 85–241)

## 2012-10-11 ENCOUNTER — Telehealth: Payer: Self-pay

## 2012-10-11 NOTE — Telephone Encounter (Signed)
Pt's PCP is Dr Marcelle Overlie. Pt had to see Dr Welton Flakes since Dr Marcelle Overlie was out of town. Dr Welton Flakes told pt to stop percocet because he has sleep apnea and he may not wake up if he took percocet. He has been taking percocet long term for back and shoulder pain. He was seen in the pain clinic in [facility] but could not continue to go there because it got too expensive. Pt states Dr Kirke Corin told him he needed to take percocet because his pain may get too bad and cause him to have a heart attack. I will forward to Dr Kirke Corin for review.

## 2012-10-11 NOTE — Telephone Encounter (Signed)
I can not refill pain medications. These have to come from PCP or pain clinic.

## 2012-10-11 NOTE — Telephone Encounter (Signed)
Pt states Dr Kirke Corin told him to stay on his pain meds so he would not have another heart attack, last week he saw Dr Yves Dill and she told him to stop his pain meds, pt wants to know if dr Kirke Corin will re fill it. Please advise

## 2012-10-11 NOTE — Telephone Encounter (Signed)
LMTCB

## 2012-10-12 ENCOUNTER — Encounter: Payer: Self-pay | Admitting: *Deleted

## 2012-10-12 NOTE — Telephone Encounter (Signed)
Patient aware note ready for pick up. Placed at the front desk.

## 2012-10-12 NOTE — Telephone Encounter (Signed)
I spoke with the patient. He states that he doesn't need for Dr. Kirke Corin to refill pain medications. He needs a letter to take to his PCP office stating that Dr. Kirke Corin recommends that he stay on pain medications in order to control his pain and protect him from having a heart attack. I advised the patient that Dr. Kirke Corin would do a letter or offered to refer him to a local pain clinic. Per the patient, he would rather have a letter to pick up on Thursday around 1:15 pm to take with him.

## 2012-10-25 ENCOUNTER — Ambulatory Visit: Payer: Self-pay | Admitting: Gastroenterology

## 2012-10-27 ENCOUNTER — Ambulatory Visit: Payer: Self-pay | Admitting: Internal Medicine

## 2012-11-09 ENCOUNTER — Emergency Department: Payer: Self-pay | Admitting: Emergency Medicine

## 2012-11-09 LAB — COMPREHENSIVE METABOLIC PANEL
Albumin: 3.6 g/dL (ref 3.4–5.0)
Alkaline Phosphatase: 111 U/L (ref 50–136)
Anion Gap: 7 (ref 7–16)
BUN: 12 mg/dL (ref 7–18)
Bilirubin,Total: 0.3 mg/dL (ref 0.2–1.0)
Calcium, Total: 8.3 mg/dL — ABNORMAL LOW (ref 8.5–10.1)
Chloride: 110 mmol/L — ABNORMAL HIGH (ref 98–107)
Co2: 25 mmol/L (ref 21–32)
Creatinine: 1.34 mg/dL — ABNORMAL HIGH (ref 0.60–1.30)
EGFR (African American): >60
EGFR (Non-African Amer.): 59 — ABNORMAL LOW
Glucose: 98 mg/dL (ref 65–99)
Osmolality: 283 (ref 275–301)
Potassium: 4.1 mmol/L (ref 3.5–5.1)
SGOT(AST): 24 U/L (ref 15–37)
SGPT (ALT): 16 U/L (ref 12–78)
Sodium: 142 mmol/L (ref 136–145)
Total Protein: 7.1 g/dL (ref 6.4–8.2)

## 2012-11-09 LAB — CBC
MCH: 24.6 pg — ABNORMAL LOW (ref 26.0–34.0)
MCHC: 32.5 g/dL (ref 32.0–36.0)
Platelet: 249 10*3/uL (ref 150–440)
RDW: 20.3 % — ABNORMAL HIGH (ref 11.5–14.5)
WBC: 9.5 10*3/uL (ref 3.8–10.6)

## 2012-11-09 LAB — CK TOTAL AND CKMB (NOT AT ARMC)
CK, Total: 70 U/L (ref 35–232)
CK-MB: 1.3 ng/mL (ref 0.5–3.6)

## 2012-11-09 LAB — TROPONIN I: Troponin-I: <0.02 ng/mL

## 2012-11-10 ENCOUNTER — Ambulatory Visit: Payer: Self-pay | Admitting: Internal Medicine

## 2012-11-10 ENCOUNTER — Telehealth: Payer: Self-pay

## 2012-11-10 NOTE — Telephone Encounter (Signed)
Spoke w/ pt.  He states that he was scheduled to have a colonoscopy/endoscopy yesterday, but started sweating profusely, felt dizzy, and BP dropped to 71/49 in office. He was given 2 bags of fluids and felt better.  He did not take any of his bp meds yesterday or today.  BP this am is 112/70. He started having similar symptoms this morning.   States that he has not taken his lasix recently, as hx of hypotension with volume depletion and "the more I pee, the worse I feel". Reports that he stays hydrated with water & Pepsi.  Was previously instructed to avoid Gatorade. Pt has appt to see Dr. Kirke Corin on Friday. Instructed pt to stay hydrated, keep his appt, and call the office if he has similar symptoms.

## 2012-11-12 ENCOUNTER — Ambulatory Visit (INDEPENDENT_AMBULATORY_CARE_PROVIDER_SITE_OTHER): Payer: Medicaid Other | Admitting: Cardiovascular Disease

## 2012-11-12 ENCOUNTER — Encounter: Payer: Self-pay | Admitting: Cardiovascular Disease

## 2012-11-12 VITALS — BP 122/72 | HR 76 | Ht 70.0 in | Wt 243.5 lb

## 2012-11-12 DIAGNOSIS — I10 Essential (primary) hypertension: Secondary | ICD-10-CM

## 2012-11-12 DIAGNOSIS — R0602 Shortness of breath: Secondary | ICD-10-CM

## 2012-11-12 DIAGNOSIS — I2510 Atherosclerotic heart disease of native coronary artery without angina pectoris: Secondary | ICD-10-CM

## 2012-11-12 NOTE — Assessment & Plan Note (Signed)
I think his recent low blood pressure was likely related to volume depletion. He tends to get dehydrated quickly. I advised him to use support stockings for lower extremity edema trying to avoid using diuretics.

## 2012-11-12 NOTE — Patient Instructions (Signed)
Use knee support stocking for swelling in your legs.  Do not take your blood pressure medications on the morning of colonoscopy.   Follow up in 3 months.

## 2012-11-12 NOTE — Progress Notes (Signed)
HPI  This is a 57 year old male who is here today for a followup visit regarding coronary artery disease, chronic diastolic heart failure and labile hypertension.  He underwent diagnostic catheterization in November of 2013 secondary to a non-STEMI following back surgery. He was found to have a subtotal occlusion of the right coronary artery with good left-to-right collaterals and was medically managed. He was readmitted December 30 secondary to atypical chest pain hypertension and diastolic CHF. He ruled out for MI.  He continues to have problems with labile hypertension. He suffers from recurrent lower extremity edema which usually responds to Lasix but he tends to become volume depleted quickly.  He was being prepped recently for colonoscopy but when he went to have the procedure done, he was noted to be hypotensive. He probably was volume depleted. Otherwise he denies any chest pain or dyspnea. His blood pressure is back to baseline.  Allergies  Allergen Reactions  . Ibuprofen   . Tramadol      Current Outpatient Prescriptions on File Prior to Visit  Medication Sig Dispense Refill  . acetaminophen (TYLENOL) 500 MG tablet Take 1,000 mg by mouth every 6 (six) hours as needed. pain      . albuterol (PROVENTIL HFA;VENTOLIN HFA) 108 (90 BASE) MCG/ACT inhaler Inhale 2 puffs into the lungs every 6 (six) hours as needed. Wheezing and shortness of breath      . aspirin EC 81 MG tablet Take 81 mg by mouth once.      Marland Kitchen atorvastatin (LIPITOR) 40 MG tablet Take 40 mg by mouth daily.      . Blood Pressure Monitoring (B-D ASSURE BPM/AUTO ARM CUFF) MISC 1 Device by Does not apply route daily.  1 each  0  . carvedilol (COREG) 12.5 MG tablet Take 1 tablet (12.5 mg total) by mouth 2 (two) times daily.  180 tablet  3  . clopidogrel (PLAVIX) 75 MG tablet Take 1 tablet (75 mg total) by mouth daily.  30 tablet  5  . enalapril (VASOTEC) 5 MG tablet Take 5 mg by mouth daily.      . Fluticasone-Salmeterol  (ADVAIR DISKUS) 250-50 MCG/DOSE AEPB Inhale 1 puff into the lungs every 12 (twelve) hours.      . furosemide (LASIX) 20 MG tablet Take 1 tablet (20 mg total) by mouth as needed.  90 tablet  3  . gabapentin (NEURONTIN) 800 MG tablet Take 800 mg by mouth 4 (four) times daily.      . Omega-3 Fatty Acids (FISH OIL) 300 MG CAPS Take 1 capsule (300 mg total) by mouth daily.  30 capsule  0  . oxyCODONE-acetaminophen (PERCOCET) 10-325 MG per tablet Take 1 tablet by mouth as needed for pain.      Marland Kitchen tiotropium (SPIRIVA) 18 MCG inhalation capsule Place 18 mcg into inhaler and inhale daily.       No current facility-administered medications on file prior to visit.     Past Medical History  Diagnosis Date  . Hypertension   . Degenerative joint disease of spine     a. 11/2011 s/p L4-L5 fusion.  . Chronic back pain   . Chronic diastolic CHF (congestive heart failure)   . COPD (chronic obstructive pulmonary disease)   . CAD (coronary artery disease)     a. 11/2011 NSTEMI/Cath: subtotal occlusion of the mid RCA with L->R collats, nl LV fxn-->medical Rx;  . Nephrolithiasis   . Tobacco abuse     a. 80+ pack year hx  .  Rupture of rotator cuff of shoulder     left     Past Surgical History  Procedure Laterality Date  . Shoulder surgery    . Knee surgery    . Wrist surgery    . Nose surgery    . Carpal tunnel release    . Kidney stone      removed  . L4-l5 spine surgery  2013  . Cardiac catheterization  12/15/2011     No family history on file.   History   Social History  . Marital Status: Legally Separated    Spouse Name: N/A    Number of Children: N/A  . Years of Education: N/A   Occupational History  . Not on file.   Social History Main Topics  . Smoking status: Current Some Day Smoker -- 0.50 packs/day for 42 years    Types: Cigarettes  . Smokeless tobacco: Not on file  . Alcohol Use: Yes     Comment: seldom  . Drug Use: Yes     Comment: past  . Sexual Activity:     Other Topics Concern  . Not on file   Social History Narrative  . No narrative on file      PHYSICAL EXAM   There were no vitals taken for this visit. Constitutional: He is oriented to person, place, and time. He appears well-developed and well-nourished. No distress.  HENT: No nasal discharge.  Head: Normocephalic and atraumatic.  Eyes: Pupils are equal and round. Right eye exhibits no discharge. Left eye exhibits no discharge.  Neck: Normal range of motion. Neck supple. No JVD present. No thyromegaly present.  Cardiovascular: Normal rate, regular rhythm, normal heart sounds and. Exam reveals no gallop and no friction rub. No murmur heard.  Pulmonary/Chest: Effort normal and breath sounds normal. No stridor. No respiratory distress. He has no wheezes. He has no rales. He exhibits no tenderness.  Abdominal: Soft. Bowel sounds are normal. He exhibits no distension. There is no tenderness. There is no rebound and no guarding.  Musculoskeletal: Normal range of motion. He exhibits trace edema and no tenderness.  Neurological: He is alert and oriented to person, place, and time. Coordination normal.  Skin: Skin is warm and dry. No rash noted. He is not diaphoretic. No erythema. No pallor.  Psychiatric: He has a normal mood and affect. His behavior is normal. Judgment and thought content normal.     EKG: Sinus  Rhythm  WITHIN NORMAL LIMITS    ASSESSMENT AND PLAN

## 2012-11-12 NOTE — Assessment & Plan Note (Signed)
He has no symptoms of angina. Continue medical therapy. He is on Plavix at present the November of 2014. This can be held for colonoscopy.

## 2012-12-06 ENCOUNTER — Other Ambulatory Visit: Payer: Self-pay | Admitting: *Deleted

## 2012-12-06 MED ORDER — ATORVASTATIN CALCIUM 40 MG PO TABS: 40.0000 mg | ORAL_TABLET | Freq: Every day | ORAL | Status: DC

## 2012-12-06 NOTE — Telephone Encounter (Signed)
Requested Prescriptions   Signed Prescriptions Disp Refills  . atorvastatin (LIPITOR) 40 MG tablet 30 tablet 3    Sig: Take 1 tablet (40 mg total) by mouth daily.    Authorizing Provider: ARIDA, MUHAMMAD A    Ordering User: LOPEZ, MARINA C    

## 2012-12-19 ENCOUNTER — Inpatient Hospital Stay: Payer: Self-pay | Admitting: Internal Medicine

## 2012-12-19 LAB — HEPATIC FUNCTION PANEL A (ARMC)
Albumin: 3.3 g/dL — ABNORMAL LOW (ref 3.4–5.0)
Bilirubin, Direct: <0.1 mg/dL (ref 0.00–0.20)
Bilirubin,Total: 0.3 mg/dL (ref 0.2–1.0)
SGPT (ALT): 16 U/L (ref 12–78)
Total Protein: 6.9 g/dL (ref 6.4–8.2)

## 2012-12-19 LAB — IRON AND TIBC
Iron Bind.Cap.(Total): 574 ug/dL — ABNORMAL HIGH (ref 250–450)
Iron Saturation: 6 %
Iron: 33 ug/dL — ABNORMAL LOW (ref 65–175)
Unbound Iron-Bind.Cap.: 541 ug/dL

## 2012-12-19 LAB — URINALYSIS, COMPLETE
Nitrite: NEGATIVE
Ph: 5 (ref 4.5–8.0)
Protein: 30
RBC,UR: 3 /HPF (ref 0–5)
Squamous Epithelial: <1
WBC UR: 1 /HPF (ref 0–5)

## 2012-12-19 LAB — BASIC METABOLIC PANEL
Anion Gap: 9 (ref 7–16)
BUN: 47 mg/dL — ABNORMAL HIGH (ref 7–18)
Chloride: 102 mmol/L (ref 98–107)
Creatinine: 3.38 mg/dL — ABNORMAL HIGH (ref 0.60–1.30)
EGFR (African American): 22 — ABNORMAL LOW
EGFR (Non-African Amer.): 19 — ABNORMAL LOW
Glucose: 119 mg/dL — ABNORMAL HIGH (ref 65–99)
Osmolality: 285 (ref 275–301)
Potassium: 4 mmol/L (ref 3.5–5.1)
Sodium: 136 mmol/L (ref 136–145)

## 2012-12-19 LAB — CBC
HCT: 34.4 % — ABNORMAL LOW (ref 40.0–52.0)
HGB: 10.8 g/dL — ABNORMAL LOW (ref 13.0–18.0)
MCH: 23.8 pg — ABNORMAL LOW (ref 26.0–34.0)
MCHC: 31.5 g/dL — ABNORMAL LOW (ref 32.0–36.0)
MCV: 75 fL — ABNORMAL LOW (ref 80–100)
RBC: 4.56 10*6/uL (ref 4.40–5.90)
RDW: 19.2 % — ABNORMAL HIGH (ref 11.5–14.5)
WBC: 13.5 10*3/uL — ABNORMAL HIGH (ref 3.8–10.6)

## 2012-12-19 LAB — CK-MB
CK-MB: 11.5 ng/mL — ABNORMAL HIGH (ref 0.5–3.6)
CK-MB: 8.9 ng/mL — ABNORMAL HIGH (ref 0.5–3.6)

## 2012-12-19 LAB — DRUG SCREEN, URINE
Amphetamines, Ur Screen: NEGATIVE (ref ?–1000)
Cannabinoid 50 Ng, Ur ~~LOC~~: NEGATIVE (ref ?–50)
Cocaine Metabolite,Ur ~~LOC~~: NEGATIVE (ref ?–300)
MDMA (Ecstasy)Ur Screen: NEGATIVE (ref ?–500)
Opiate, Ur Screen: POSITIVE (ref ?–300)
Tricyclic, Ur Screen: NEGATIVE (ref ?–1000)

## 2012-12-19 LAB — PROTIME-INR
INR: 1
Prothrombin Time: 13.6 secs (ref 11.5–14.7)

## 2012-12-19 LAB — TROPONIN I
Troponin-I: 0.17 ng/mL — ABNORMAL HIGH
Troponin-I: 0.62 ng/mL — ABNORMAL HIGH

## 2012-12-19 LAB — CK: CK, Total: 338 U/L — ABNORMAL HIGH (ref 35–232)

## 2012-12-20 ENCOUNTER — Ambulatory Visit: Payer: Medicaid Other | Admitting: Cardiovascular Disease

## 2012-12-20 DIAGNOSIS — I5032 Chronic diastolic (congestive) heart failure: Secondary | ICD-10-CM

## 2012-12-20 DIAGNOSIS — R7989 Other specified abnormal findings of blood chemistry: Secondary | ICD-10-CM

## 2012-12-20 DIAGNOSIS — I959 Hypotension, unspecified: Secondary | ICD-10-CM

## 2012-12-20 DIAGNOSIS — N289 Disorder of kidney and ureter, unspecified: Secondary | ICD-10-CM

## 2012-12-20 LAB — COMPREHENSIVE METABOLIC PANEL
Albumin: 3 g/dL — ABNORMAL LOW (ref 3.4–5.0)
Alkaline Phosphatase: 118 U/L — ABNORMAL HIGH
Anion Gap: 9 (ref 7–16)
BUN: 31 mg/dL — ABNORMAL HIGH (ref 7–18)
Calcium, Total: 7.6 mg/dL — ABNORMAL LOW (ref 8.5–10.1)
Chloride: 106 mmol/L (ref 98–107)
Co2: 25 mmol/L (ref 21–32)
EGFR (Non-African Amer.): >60
Glucose: 142 mg/dL — ABNORMAL HIGH (ref 65–99)
Potassium: 3.9 mmol/L (ref 3.5–5.1)
SGOT(AST): 22 U/L (ref 15–37)
Total Protein: 6.4 g/dL (ref 6.4–8.2)

## 2012-12-20 LAB — CBC WITH DIFFERENTIAL/PLATELET
Basophil #: 0 10*3/uL (ref 0.0–0.1)
Basophil %: 0.4 %
MCH: 24.1 pg — ABNORMAL LOW (ref 26.0–34.0)
Monocyte %: 9.3 %
Neutrophil #: 8.1 10*3/uL — ABNORMAL HIGH (ref 1.4–6.5)
Neutrophil %: 80 %
RBC: 3.79 10*6/uL — ABNORMAL LOW (ref 4.40–5.90)
RDW: 19.3 % — ABNORMAL HIGH (ref 11.5–14.5)
WBC: 10.1 10*3/uL (ref 3.8–10.6)

## 2012-12-20 LAB — DRUG SCREEN, URINE
Amphetamines, Ur Screen: NEGATIVE (ref ?–1000)
Benzodiazepine, Ur Scrn: NEGATIVE (ref ?–200)
Cannabinoid 50 Ng, Ur ~~LOC~~: NEGATIVE (ref ?–50)
Cocaine Metabolite,Ur ~~LOC~~: NEGATIVE (ref ?–300)
MDMA (Ecstasy)Ur Screen: NEGATIVE (ref ?–500)
Methadone, Ur Screen: NEGATIVE (ref ?–300)
Opiate, Ur Screen: POSITIVE (ref ?–300)

## 2012-12-20 LAB — LIPID PANEL
Cholesterol: 109 mg/dL (ref 0–200)
HDL Cholesterol: 39 mg/dL — ABNORMAL LOW (ref 40–60)
Ldl Cholesterol, Calc: 26 mg/dL (ref 0–100)
Triglycerides: 219 mg/dL — ABNORMAL HIGH (ref 0–200)
VLDL Cholesterol, Calc: 44 mg/dL — ABNORMAL HIGH (ref 5–40)

## 2012-12-20 LAB — HEMOGLOBIN A1C: Hemoglobin A1C: 5.8 % (ref 4.2–6.3)

## 2012-12-21 LAB — URINE CULTURE

## 2012-12-24 LAB — CULTURE, BLOOD (SINGLE)

## 2012-12-27 ENCOUNTER — Other Ambulatory Visit: Payer: Self-pay

## 2012-12-27 MED ORDER — ENALAPRIL MALEATE 5 MG PO TABS: 5.0000 mg | ORAL_TABLET | Freq: Every day | ORAL | Status: DC

## 2013-01-04 ENCOUNTER — Telehealth: Payer: Self-pay | Admitting: *Deleted

## 2013-01-04 NOTE — Telephone Encounter (Signed)
Patient said he had [employer] fried chicken, biscuit, pinto beans and dirty rice for dinner last night and today his feet and hands were swollen. He said he doubled his lasix and it got a little better. He just wanted [place] to know. I coached patient on maintaining a low sodium diet. Patient denies shortness of breath or chest pain.

## 2013-01-04 NOTE — Telephone Encounter (Signed)
Patient called and hands and feet swelling bad. Took two lasiks swelling gone down a little bit. Please advise

## 2013-02-24 ENCOUNTER — Ambulatory Visit (INDEPENDENT_AMBULATORY_CARE_PROVIDER_SITE_OTHER): Payer: Medicaid Other | Admitting: Cardiovascular Disease

## 2013-02-24 ENCOUNTER — Encounter: Payer: Self-pay | Admitting: Cardiovascular Disease

## 2013-02-24 ENCOUNTER — Ambulatory Visit: Payer: Self-pay | Admitting: Internal Medicine

## 2013-02-24 VITALS — BP 191/160 | HR 82 | Ht 70.0 in | Wt 242.5 lb

## 2013-02-24 DIAGNOSIS — I509 Heart failure, unspecified: Secondary | ICD-10-CM

## 2013-02-24 DIAGNOSIS — I5032 Chronic diastolic (congestive) heart failure: Secondary | ICD-10-CM

## 2013-02-24 DIAGNOSIS — I10 Essential (primary) hypertension: Secondary | ICD-10-CM

## 2013-02-24 DIAGNOSIS — I2510 Atherosclerotic heart disease of native coronary artery without angina pectoris: Secondary | ICD-10-CM

## 2013-02-24 MED ORDER — ENALAPRIL MALEATE 20 MG PO TABS: 20.0000 mg | ORAL_TABLET | Freq: Every day | ORAL | Status: DC

## 2013-02-24 MED ORDER — ENALAPRIL MALEATE 10 MG PO TABS: 10.0000 mg | ORAL_TABLET | Freq: Every day | ORAL | Status: DC

## 2013-02-24 NOTE — Assessment & Plan Note (Signed)
He continues to have labile hypertension. I instructed him to follow a low-sodium diet. I increased enalapril to 10 mg once daily. I might consider adding a small dose thiazide diuretic if blood pressure continues to be elevated. He tends to develop hypotension with diuretics.

## 2013-02-24 NOTE — Assessment & Plan Note (Signed)
He appears to be euvolemic. 

## 2013-02-24 NOTE — Progress Notes (Signed)
HPI  This is a 58 year old male who is here today for a followup visit regarding coronary artery disease, chronic diastolic heart failure and labile hypertension.  He underwent diagnostic catheterization in November of 2013 secondary to a non-STEMI following back surgery. He was found to have a subtotal occlusion of the right coronary artery with good left-to-right collaterals and was medically managed. He was treated with Plavix for one year.  He continues to have problems with labile hypertension. He suffers from recurrent lower extremity edema which usually responds to Lasix but he tends to become volume depleted quickly. He had previous admissions due to hypotension and worsening kidney function in the setting of treatment with diuretics. Due to that, he is not using Lasix only as needed. He has been doing reasonably well. He continues to have chronic back pain. He also reports anemia and currently being treated at the cancer center. Blood pressure is elevated today. He does not follow a low-sodium diet.  Allergies  Allergen Reactions  . Ibuprofen   . Tramadol      Current Outpatient Prescriptions on File Prior to Visit  Medication Sig Dispense Refill  . acetaminophen (TYLENOL) 500 MG tablet Take 1,000 mg by mouth every 6 (six) hours as needed. pain      . albuterol (PROVENTIL HFA;VENTOLIN HFA) 108 (90 BASE) MCG/ACT inhaler Inhale 2 puffs into the lungs every 6 (six) hours as needed. Wheezing and shortness of breath      . aspirin EC 81 MG tablet Take 81 mg by mouth once.      Marland Kitchen. atorvastatin (LIPITOR) 40 MG tablet Take 1 tablet (40 mg total) by mouth daily.  30 tablet  3  . Blood Pressure Monitoring (B-D ASSURE BPM/AUTO ARM CUFF) MISC 1 Device by Does not apply route daily.  1 each  0  . carvedilol (COREG) 12.5 MG tablet Take 1 tablet (12.5 mg total) by mouth 2 (two) times daily.  180 tablet  3  . enalapril (VASOTEC) 5 MG tablet Take 1 tablet (5 mg total) by mouth daily.  30 tablet  6    . Fluticasone-Salmeterol (ADVAIR DISKUS) 250-50 MCG/DOSE AEPB Inhale 1 puff into the lungs every 12 (twelve) hours.      . furosemide (LASIX) 20 MG tablet Take 1 tablet (20 mg total) by mouth as needed.  90 tablet  3  . gabapentin (NEURONTIN) 800 MG tablet Take 800 mg by mouth 4 (four) times daily.      Marland Kitchen. oxyCODONE-acetaminophen (PERCOCET) 10-325 MG per tablet Take 1 tablet by mouth as needed for pain.      Marland Kitchen. tiotropium (SPIRIVA) 18 MCG inhalation capsule Place 18 mcg into inhaler and inhale daily.      . clopidogrel (PLAVIX) 75 MG tablet Take 1 tablet (75 mg total) by mouth daily.  30 tablet  5   No current facility-administered medications on file prior to visit.     Past Medical History  Diagnosis Date  . Hypertension   . Degenerative joint disease of spine     a. 11/2011 s/p L4-L5 fusion.  . Chronic back pain   . Chronic diastolic CHF (congestive heart failure)   . COPD (chronic obstructive pulmonary disease)   . CAD (coronary artery disease)     a. 11/2011 NSTEMI/Cath: subtotal occlusion of the mid RCA with L->R collats, nl LV fxn-->medical Rx;  . Nephrolithiasis   . Tobacco abuse     a. 80+ pack year hx  . Rupture of rotator cuff  of shoulder     left  . Anemia      Past Surgical History  Procedure Laterality Date  . Shoulder surgery    . Knee surgery    . Wrist surgery    . Nose surgery    . Carpal tunnel release    . Kidney stone      removed  . L4-l5 spine surgery  2013  . Cardiac catheterization  12/15/2011     Family History  Problem Relation Age of Onset  . Family history unknown: Yes     History   Social History  . Marital Status: Legally Separated    Spouse Name: N/A    Number of Children: N/A  . Years of Education: N/A   Occupational History  . Not on file.   Social History Main Topics  . Smoking status: Current Every Day Smoker -- 0.50 packs/day for 42 years    Types: Cigarettes  . Smokeless tobacco: Not on file  . Alcohol Use: Yes      Comment: seldom  . Drug Use: Yes     Comment: past  . Sexual Activity: Not on file   Other Topics Concern  . Not on file   Social History Narrative  . No narrative on file      PHYSICAL EXAM   BP 191/160  Pulse 82  Ht 5\' 10"  (1.778 m)  Wt 242 lb 8 oz (109.997 kg)  BMI 34.80 kg/m2 Constitutional: He is oriented to person, place, and time. He appears well-developed and well-nourished. No distress.  HENT: No nasal discharge.  Head: Normocephalic and atraumatic.  Eyes: Pupils are equal and round. Right eye exhibits no discharge. Left eye exhibits no discharge.  Neck: Normal range of motion. Neck supple. No JVD present. No thyromegaly present.  Cardiovascular: Normal rate, regular rhythm, normal heart sounds and. Exam reveals no gallop and no friction rub. No murmur heard.  Pulmonary/Chest: Effort normal and breath sounds normal. No stridor. No respiratory distress. He has no wheezes. He has no rales. He exhibits no tenderness.  Abdominal: Soft. Bowel sounds are normal. He exhibits no distension. There is no tenderness. There is no rebound and no guarding.  Musculoskeletal: Normal range of motion. He exhibits trace edema and no tenderness.  Neurological: He is alert and oriented to person, place, and time. Coordination normal.  Skin: Skin is warm and dry. No rash noted. He is not diaphoretic. No erythema. No pallor.  Psychiatric: He has a normal mood and affect. His behavior is normal. Judgment and thought content normal.       ASSESSMENT AND PLAN

## 2013-02-24 NOTE — Patient Instructions (Addendum)
Your physician has recommended you make the following change in your medication:  Increase Vasotec to 10 mg daily   Follow a low sodium diet  Your physician recommends that you schedule a follow-up appointment in:  3 months

## 2013-02-24 NOTE — Assessment & Plan Note (Signed)
He is doing well with no symptoms suggestive of angina. Continue medical therapy. 

## 2013-02-25 LAB — IRON AND TIBC
IRON SATURATION: 9 %
Iron Bind.Cap.(Total): 556 ug/dL — ABNORMAL HIGH (ref 250–450)
Iron: 49 ug/dL — ABNORMAL LOW (ref 65–175)
Unbound Iron-Bind.Cap.: 507 ug/dL

## 2013-02-25 LAB — FERRITIN: Ferritin (ARMC): 10 ng/mL (ref 8–388)

## 2013-02-27 ENCOUNTER — Ambulatory Visit: Payer: Self-pay | Admitting: Internal Medicine

## 2013-03-27 ENCOUNTER — Ambulatory Visit: Payer: Self-pay | Admitting: Internal Medicine

## 2013-03-28 LAB — OCCULT BLOOD X 1 CARD TO LAB, STOOL
OCCULT BLOOD, FECES: NEGATIVE
Occult Blood, Feces: NEGATIVE

## 2013-04-18 ENCOUNTER — Telehealth: Payer: Self-pay

## 2013-04-18 MED ORDER — ENALAPRIL MALEATE 10 MG PO TABS: 5.0000 mg | ORAL_TABLET | Freq: Every day | ORAL | Status: DC

## 2013-04-18 NOTE — Telephone Encounter (Signed)
Pt called and needs to talk about his BP medicine Enalopril. States his BP is 81/45. Please call.

## 2013-04-18 NOTE — Telephone Encounter (Signed)
Informed patient that per Dr. Kirke CorinArida it is all right to switch to 5 mg of Enalopril

## 2013-04-18 NOTE — Telephone Encounter (Signed)
Patient called and said he has been taking 5 mg of Enalopril instead of 10 mg because his pressure drops to 80's/40's when he takes 10 mg. He says he tolerates 5 mg fine. He wants to know if he can just take 5 mg.

## 2013-04-18 NOTE — Telephone Encounter (Signed)
Yes, 5 mg is fine.

## 2013-04-27 ENCOUNTER — Ambulatory Visit: Payer: Self-pay | Admitting: Internal Medicine

## 2013-05-03 ENCOUNTER — Other Ambulatory Visit: Payer: Self-pay | Admitting: Cardiovascular Disease

## 2013-05-27 ENCOUNTER — Ambulatory Visit (INDEPENDENT_AMBULATORY_CARE_PROVIDER_SITE_OTHER): Payer: Medicaid Other | Admitting: Cardiovascular Disease

## 2013-05-27 ENCOUNTER — Encounter: Payer: Self-pay | Admitting: Cardiovascular Disease

## 2013-05-27 VITALS — BP 94/68 | HR 72 | Ht 70.0 in | Wt 256.0 lb

## 2013-05-27 DIAGNOSIS — I509 Heart failure, unspecified: Secondary | ICD-10-CM

## 2013-05-27 DIAGNOSIS — I5032 Chronic diastolic (congestive) heart failure: Secondary | ICD-10-CM

## 2013-05-27 DIAGNOSIS — G8929 Other chronic pain: Secondary | ICD-10-CM

## 2013-05-27 DIAGNOSIS — I10 Essential (primary) hypertension: Secondary | ICD-10-CM

## 2013-05-27 DIAGNOSIS — I2510 Atherosclerotic heart disease of native coronary artery without angina pectoris: Secondary | ICD-10-CM

## 2013-05-27 DIAGNOSIS — M549 Dorsalgia, unspecified: Secondary | ICD-10-CM

## 2013-05-27 NOTE — Progress Notes (Signed)
HPI  This is a 58 year old male who is here today for a followup visit regarding coronary artery disease, chronic diastolic heart failure and labile hypertension.  He underwent diagnostic catheterization in November of 2013 secondary to a non-STEMI following back surgery. He was found to have a subtotal occlusion of the right coronary artery with good left-to-right collaterals and was medically managed. He was treated with Plavix for one year.  He continues to have problems with labile hypertension. He suffers from recurrent lower extremity edema which usually responds to Lasix but he tends to become volume depleted quickly. He had previous admissions due to hypotension and worsening kidney function in the setting of treatment with diuretics. Due to that, he is not using Lasix only as needed. His biggest issue at the present time his worsening chronic back pain. [facility] that repeat surgery would not be helpful. He wants to go for a second opinion. He continues to gain weight. He is less active than before.  Allergies  Allergen Reactions  . Ibuprofen   . Tramadol      Current Outpatient Prescriptions on File Prior to Visit  Medication Sig Dispense Refill  . acetaminophen (TYLENOL) 500 MG tablet Take 1,000 mg by mouth every 6 (six) hours as needed. pain      . albuterol (PROVENTIL HFA;VENTOLIN HFA) 108 (90 BASE) MCG/ACT inhaler Inhale 2 puffs into the lungs every 6 (six) hours as needed. Wheezing and shortness of breath      . aspirin EC 81 MG tablet Take 81 mg by mouth once.      Marland Kitchen. atorvastatin (LIPITOR) 40 MG tablet TAKE ONE TABLET EVERY EVENING  30 tablet  3  . Blood Pressure Monitoring (B-D ASSURE BPM/AUTO ARM CUFF) MISC 1 Device by Does not apply route daily.  1 each  0  . carvedilol (COREG) 12.5 MG tablet Take 1 tablet (12.5 mg total) by mouth 2 (two) times daily.  180 tablet  3  . Fluticasone-Salmeterol (ADVAIR DISKUS) 250-50 MCG/DOSE AEPB Inhale 1 puff into the lungs every  12 (twelve) hours.      . furosemide (LASIX) 20 MG tablet Take 1 tablet (20 mg total) by mouth as needed.  90 tablet  3  . gabapentin (NEURONTIN) 800 MG tablet Take 800 mg by mouth 4 (four) times daily.      Marland Kitchen. oxyCODONE-acetaminophen (PERCOCET) 10-325 MG per tablet Take 1 tablet by mouth as needed for pain.      Marland Kitchen. tiotropium (SPIRIVA) 18 MCG inhalation capsule Place 18 mcg into inhaler and inhale daily.       No current facility-administered medications on file prior to visit.     Past Medical History  Diagnosis Date  . Hypertension   . Degenerative joint disease of spine     a. 11/2011 s/p L4-L5 fusion.  . Chronic back pain   . Chronic diastolic CHF (congestive heart failure)   . COPD (chronic obstructive pulmonary disease)   . CAD (coronary artery disease)     a. 11/2011 NSTEMI/Cath: subtotal occlusion of the mid RCA with L->R collats, nl LV fxn-->medical Rx;  . Nephrolithiasis   . Tobacco abuse     a. 80+ pack year hx  . Rupture of rotator cuff of shoulder     left  . Anemia      Past Surgical History  Procedure Laterality Date  . Shoulder surgery    . Knee surgery    . Wrist surgery    .  Nose surgery    . Carpal tunnel release    . Kidney stone      removed  . L4-l5 spine surgery  2013  . Cardiac catheterization  12/15/2011     Family History  Problem Relation Age of Onset  . Family history unknown: Yes     History   Social History  . Marital Status: Legally Separated    Spouse Name: N/A    Number of Children: N/A  . Years of Education: N/A   Occupational History  . Not on file.   Social History Main Topics  . Smoking status: Current Every Day Smoker -- 0.50 packs/day for 42 years    Types: Cigarettes  . Smokeless tobacco: Not on file  . Alcohol Use: No     Comment: seldom  . Drug Use: No     Comment: past  . Sexual Activity: Not on file   Other Topics Concern  . Not on file   Social History Narrative  . No narrative on file       PHYSICAL EXAM   BP 94/68  Pulse 72  Ht 5\' 10"  (1.778 m)  Wt 256 lb (116.121 kg)  BMI 36.73 kg/m2 Constitutional: He is oriented to person, place, and time. He appears well-developed and well-nourished. No distress.  HENT: No nasal discharge.  Head: Normocephalic and atraumatic.  Eyes: Pupils are equal and round. Right eye exhibits no discharge. Left eye exhibits no discharge.  Neck: Normal range of motion. Neck supple. No JVD present. No thyromegaly present.  Cardiovascular: Normal rate, regular rhythm, normal heart sounds and. Exam reveals no gallop and no friction rub. No murmur heard.  Pulmonary/Chest: Effort normal and breath sounds normal. No stridor. No respiratory distress. He has no wheezes. He has no rales. He exhibits no tenderness.  Abdominal: Soft. Bowel sounds are normal. He exhibits no distension. There is no tenderness. There is no rebound and no guarding.  Musculoskeletal: Normal range of motion. He exhibits trace edema and no tenderness.  Neurological: He is alert and oriented to person, place, and time. Coordination normal.  Skin: Skin is warm and dry. No rash noted. He is not diaphoretic. No erythema. No pallor.  Psychiatric: He has a normal mood and affect. His behavior is normal. Judgment and thought content normal.       ASSESSMENT AND PLAN

## 2013-05-27 NOTE — Patient Instructions (Signed)
Continue same medications.   Your physician wants you to follow-up in: 6 months.  You will receive a reminder letter in the mail two months in advance. If you don't receive a letter, please call our office to schedule the follow-up appointment.  

## 2013-05-28 NOTE — Assessment & Plan Note (Signed)
He appears to be euvolemic at the present time. He uses Lasix as needed.

## 2013-05-28 NOTE — Assessment & Plan Note (Signed)
Blood pressure is reasonably controlled on current medications. 

## 2013-05-28 NOTE — Assessment & Plan Note (Signed)
He is stable from a cardiac standpoint with no angina. Continue medical therapy.

## 2013-05-28 NOTE — Assessment & Plan Note (Signed)
This continues to be a major issue for him. He continues to be less active and has gained weight. I asked him to consider water aerobics. If there is no surgical treatment for his disease, he might need to followup with the pain clinic.

## 2013-05-30 ENCOUNTER — Ambulatory Visit: Payer: Self-pay | Admitting: Internal Medicine

## 2013-05-30 LAB — CBC CANCER CENTER
Basophil #: 0.1 x10 3/mm (ref 0.0–0.1)
Basophil %: 0.7 %
EOS ABS: 0.3 x10 3/mm (ref 0.0–0.7)
Eosinophil %: 2.8 %
HCT: 39 % — ABNORMAL LOW (ref 40.0–52.0)
HGB: 13.2 g/dL (ref 13.0–18.0)
LYMPHS PCT: 14.5 %
Lymphocyte #: 1.8 x10 3/mm (ref 1.0–3.6)
MCH: 29.1 pg (ref 26.0–34.0)
MCHC: 34 g/dL (ref 32.0–36.0)
MCV: 86 fL (ref 80–100)
MONOS PCT: 9 %
Monocyte #: 1.1 x10 3/mm — ABNORMAL HIGH (ref 0.2–1.0)
Neutrophil #: 8.9 x10 3/mm — ABNORMAL HIGH (ref 1.4–6.5)
Neutrophil %: 73 %
Platelet: 191 x10 3/mm (ref 150–440)
RBC: 4.55 10*6/uL (ref 4.40–5.90)
RDW: 16.3 % — AB (ref 11.5–14.5)
WBC: 12.2 x10 3/mm — ABNORMAL HIGH (ref 3.8–10.6)

## 2013-05-30 LAB — IRON AND TIBC
Iron Bind.Cap.(Total): 396 ug/dL (ref 250–450)
Iron Saturation: 14 %
Iron: 55 ug/dL — ABNORMAL LOW (ref 65–175)
Unbound Iron-Bind.Cap.: 341 ug/dL

## 2013-05-30 LAB — FERRITIN: Ferritin (ARMC): 61 ng/mL (ref 8–388)

## 2013-06-23 ENCOUNTER — Other Ambulatory Visit: Payer: Self-pay | Admitting: *Deleted

## 2013-06-23 MED ORDER — CARVEDILOL 12.5 MG PO TABS: 12.5000 mg | ORAL_TABLET | Freq: Two times a day (BID) | ORAL | Status: DC

## 2013-06-23 NOTE — Telephone Encounter (Signed)
Requested Prescriptions   Signed Prescriptions Disp Refills  . carvedilol (COREG) 12.5 MG tablet 180 tablet 3    Sig: Take 1 tablet (12.5 mg total) by mouth 2 (two) times daily.    Authorizing Provider: ARIDA, MUHAMMAD A    Ordering User: Rhone Ozaki C    

## 2013-06-24 ENCOUNTER — Telehealth: Payer: Self-pay

## 2013-06-24 NOTE — Telephone Encounter (Signed)
Spoke w/ pt.  He states that his BP has been running low recently, in the 90-80/50 range.  He feels dizzy when BP drops.  Last week, he was in [place] when he felt like he was going to pass out, so he went to [facility]. Reports his BP was 80/50, EKG normal.  He would like an appt to see Dr. Kirke Corin. Pt sched to see Dr. Kirke Corin on 06/30/13 @ 10:30.

## 2013-06-27 ENCOUNTER — Ambulatory Visit: Payer: Self-pay | Admitting: Internal Medicine

## 2013-06-27 ENCOUNTER — Inpatient Hospital Stay: Payer: Self-pay | Admitting: Internal Medicine

## 2013-06-27 LAB — DRUG SCREEN, URINE
Amphetamines, Ur Screen: NEGATIVE (ref ?–1000)
BARBITURATES, UR SCREEN: NEGATIVE (ref ?–200)
Benzodiazepine, Ur Scrn: POSITIVE (ref ?–200)
CANNABINOID 50 NG, UR ~~LOC~~: NEGATIVE (ref ?–50)
Cocaine Metabolite,Ur ~~LOC~~: NEGATIVE (ref ?–300)
MDMA (Ecstasy)Ur Screen: NEGATIVE (ref ?–500)
METHADONE, UR SCREEN: NEGATIVE (ref ?–300)
Opiate, Ur Screen: POSITIVE (ref ?–300)
Phencyclidine (PCP) Ur S: NEGATIVE (ref ?–25)
Tricyclic, Ur Screen: NEGATIVE (ref ?–1000)

## 2013-06-27 LAB — BASIC METABOLIC PANEL
Anion Gap: 4 — ABNORMAL LOW (ref 7–16)
BUN: 22 mg/dL — ABNORMAL HIGH (ref 7–18)
CHLORIDE: 101 mmol/L (ref 98–107)
Calcium, Total: 8.7 mg/dL (ref 8.5–10.1)
Co2: 32 mmol/L (ref 21–32)
Creatinine: 1.1 mg/dL (ref 0.60–1.30)
Glucose: 107 mg/dL — ABNORMAL HIGH (ref 65–99)
Osmolality: 278 (ref 275–301)
Potassium: 3.9 mmol/L (ref 3.5–5.1)
Sodium: 137 mmol/L (ref 136–145)

## 2013-06-27 LAB — CBC
HCT: 40.3 % (ref 40.0–52.0)
HGB: 13.6 g/dL (ref 13.0–18.0)
MCH: 30.2 pg (ref 26.0–34.0)
MCHC: 33.8 g/dL (ref 32.0–36.0)
MCV: 89 fL (ref 80–100)
PLATELETS: 207 10*3/uL (ref 150–440)
RBC: 4.52 10*6/uL (ref 4.40–5.90)
RDW: 14.7 % — AB (ref 11.5–14.5)
WBC: 9.5 10*3/uL (ref 3.8–10.6)

## 2013-06-27 LAB — TROPONIN I: Troponin-I: <0.02 ng/mL

## 2013-06-27 LAB — PRO B NATRIURETIC PEPTIDE: B-TYPE NATIURETIC PEPTID: 590 pg/mL — AB (ref 0–125)

## 2013-06-28 DIAGNOSIS — I5032 Chronic diastolic (congestive) heart failure: Secondary | ICD-10-CM

## 2013-06-28 DIAGNOSIS — R079 Chest pain, unspecified: Secondary | ICD-10-CM

## 2013-06-28 DIAGNOSIS — I959 Hypotension, unspecified: Secondary | ICD-10-CM

## 2013-06-28 LAB — CBC WITH DIFFERENTIAL/PLATELET
Basophil #: 0 10*3/uL (ref 0.0–0.1)
Basophil %: 0.7 %
Eosinophil #: 0.2 10*3/uL (ref 0.0–0.7)
Eosinophil %: 3.4 %
HCT: 36.4 % — AB (ref 40.0–52.0)
HGB: 12.3 g/dL — AB (ref 13.0–18.0)
LYMPHS ABS: 1.3 10*3/uL (ref 1.0–3.6)
Lymphocyte %: 20.5 %
MCH: 30 pg (ref 26.0–34.0)
MCHC: 33.7 g/dL (ref 32.0–36.0)
MCV: 89 fL (ref 80–100)
Monocyte #: 0.8 x10 3/mm (ref 0.2–1.0)
Monocyte %: 12.2 %
NEUTROS PCT: 63.2 %
Neutrophil #: 4 10*3/uL (ref 1.4–6.5)
Platelet: 146 10*3/uL — ABNORMAL LOW (ref 150–440)
RBC: 4.1 10*6/uL — AB (ref 4.40–5.90)
RDW: 14.8 % — AB (ref 11.5–14.5)
WBC: 6.3 10*3/uL (ref 3.8–10.6)

## 2013-06-28 LAB — BASIC METABOLIC PANEL
Anion Gap: 6 — ABNORMAL LOW (ref 7–16)
BUN: 15 mg/dL (ref 7–18)
Calcium, Total: 8.3 mg/dL — ABNORMAL LOW (ref 8.5–10.1)
Chloride: 106 mmol/L (ref 98–107)
Co2: 29 mmol/L (ref 21–32)
Creatinine: 0.78 mg/dL (ref 0.60–1.30)
EGFR (African American): >60
Glucose: 116 mg/dL — ABNORMAL HIGH (ref 65–99)
Osmolality: 283 (ref 275–301)
POTASSIUM: 3.5 mmol/L (ref 3.5–5.1)
Sodium: 141 mmol/L (ref 136–145)

## 2013-06-28 LAB — TROPONIN I

## 2013-06-30 ENCOUNTER — Ambulatory Visit: Payer: Medicaid Other | Admitting: Cardiovascular Disease

## 2013-07-02 LAB — CULTURE, BLOOD (SINGLE)

## 2013-07-07 ENCOUNTER — Ambulatory Visit: Payer: Medicaid Other | Admitting: Cardiovascular Disease

## 2013-07-09 ENCOUNTER — Inpatient Hospital Stay: Payer: Self-pay | Admitting: Surgery

## 2013-07-09 LAB — COMPREHENSIVE METABOLIC PANEL
ALK PHOS: 94 U/L
Albumin: 3.5 g/dL (ref 3.4–5.0)
Anion Gap: 9 (ref 7–16)
BUN: 31 mg/dL — ABNORMAL HIGH (ref 7–18)
Bilirubin,Total: 0.6 mg/dL (ref 0.2–1.0)
CREATININE: 4.54 mg/dL — AB (ref 0.60–1.30)
Calcium, Total: 8.2 mg/dL — ABNORMAL LOW (ref 8.5–10.1)
Chloride: 98 mmol/L (ref 98–107)
Co2: 27 mmol/L (ref 21–32)
EGFR (African American): 16 — ABNORMAL LOW
GFR CALC NON AF AMER: 13 — AB
Glucose: 105 mg/dL — ABNORMAL HIGH (ref 65–99)
Osmolality: 275 (ref 275–301)
Potassium: 3.7 mmol/L (ref 3.5–5.1)
SGOT(AST): 23 U/L (ref 15–37)
SGPT (ALT): 16 U/L (ref 12–78)
SODIUM: 134 mmol/L — AB (ref 136–145)
TOTAL PROTEIN: 7.2 g/dL (ref 6.4–8.2)

## 2013-07-09 LAB — PRO B NATRIURETIC PEPTIDE: B-TYPE NATIURETIC PEPTID: 616 pg/mL — AB (ref 0–125)

## 2013-07-09 LAB — CBC
HCT: 37.6 % — AB (ref 40.0–52.0)
HGB: 12.2 g/dL — ABNORMAL LOW (ref 13.0–18.0)
MCH: 29.3 pg (ref 26.0–34.0)
MCHC: 32.4 g/dL (ref 32.0–36.0)
MCV: 90 fL (ref 80–100)
Platelet: 203 10*3/uL (ref 150–440)
RBC: 4.16 10*6/uL — ABNORMAL LOW (ref 4.40–5.90)
RDW: 15 % — AB (ref 11.5–14.5)
WBC: 16.4 10*3/uL — ABNORMAL HIGH (ref 3.8–10.6)

## 2013-07-09 LAB — CK TOTAL AND CKMB (NOT AT ARMC)
CK, TOTAL: 559 U/L — AB
CK-MB: 16.4 ng/mL — ABNORMAL HIGH (ref 0.5–3.6)

## 2013-07-09 LAB — URINALYSIS, COMPLETE
Bacteria: NONE SEEN
Bilirubin,UR: NEGATIVE
GLUCOSE, UR: NEGATIVE mg/dL (ref 0–75)
Hyaline Cast: 32
Ketone: NEGATIVE
Leukocyte Esterase: NEGATIVE
NITRITE: NEGATIVE
Ph: 5 (ref 4.5–8.0)
Protein: 100
SQUAMOUS EPITHELIAL: NONE SEEN
Specific Gravity: 1.027 (ref 1.003–1.030)

## 2013-07-09 LAB — TROPONIN I

## 2013-07-10 LAB — BASIC METABOLIC PANEL
Anion Gap: 6 — ABNORMAL LOW (ref 7–16)
BUN: 23 mg/dL — ABNORMAL HIGH (ref 7–18)
Calcium, Total: 7.6 mg/dL — ABNORMAL LOW (ref 8.5–10.1)
Chloride: 104 mmol/L (ref 98–107)
Co2: 26 mmol/L (ref 21–32)
Creatinine: 1.83 mg/dL — ABNORMAL HIGH (ref 0.60–1.30)
EGFR (African American): 47 — ABNORMAL LOW
EGFR (Non-African Amer.): 40 — ABNORMAL LOW
Glucose: 116 mg/dL — ABNORMAL HIGH (ref 65–99)
Osmolality: 277 (ref 275–301)
Potassium: 4 mmol/L (ref 3.5–5.1)
Sodium: 136 mmol/L (ref 136–145)

## 2013-07-10 LAB — LIPASE, BLOOD: Lipase: 132 U/L (ref 73–393)

## 2013-07-10 LAB — CBC WITH DIFFERENTIAL/PLATELET
Basophil #: 0 10*3/uL (ref 0.0–0.1)
Basophil %: 0.3 %
Eosinophil #: 0.1 10*3/uL (ref 0.0–0.7)
Eosinophil %: 0.9 %
HCT: 36.6 % — ABNORMAL LOW (ref 40.0–52.0)
HGB: 12.1 g/dL — ABNORMAL LOW (ref 13.0–18.0)
Lymphocyte #: 1.3 10*3/uL (ref 1.0–3.6)
Lymphocyte %: 9.9 %
MCH: 30.2 pg (ref 26.0–34.0)
MCHC: 33.2 g/dL (ref 32.0–36.0)
MCV: 91 fL (ref 80–100)
Monocyte #: 1.3 x10 3/mm — ABNORMAL HIGH (ref 0.2–1.0)
Monocyte %: 10.3 %
Neutrophil #: 10.3 10*3/uL — ABNORMAL HIGH (ref 1.4–6.5)
Neutrophil %: 78.6 %
Platelet: 181 10*3/uL (ref 150–440)
RBC: 4.02 10*6/uL — ABNORMAL LOW (ref 4.40–5.90)
RDW: 14.7 % — ABNORMAL HIGH (ref 11.5–14.5)
WBC: 13 10*3/uL — ABNORMAL HIGH (ref 3.8–10.6)

## 2013-07-10 LAB — TSH: THYROID STIMULATING HORM: 0.248 u[IU]/mL — AB

## 2013-07-10 LAB — CK TOTAL AND CKMB (NOT AT ARMC)
CK, Total: 753 U/L — ABNORMAL HIGH
CK, Total: 651 U/L — ABNORMAL HIGH
CK-MB: 19.1 ng/mL — ABNORMAL HIGH (ref 0.5–3.6)
CK-MB: 14.2 ng/mL — ABNORMAL HIGH (ref 0.5–3.6)

## 2013-07-10 LAB — TROPONIN I: TROPONIN-I: 0.02 ng/mL

## 2013-07-10 LAB — T4, FREE: Free Thyroxine: 0.99 ng/dL (ref 0.76–1.46)

## 2013-07-11 LAB — CBC WITH DIFFERENTIAL/PLATELET
Basophil #: 0 10*3/uL (ref 0.0–0.1)
Basophil %: 0.3 %
Eosinophil #: 0 10*3/uL (ref 0.0–0.7)
Eosinophil %: 0.1 %
HCT: 31.9 % — ABNORMAL LOW (ref 40.0–52.0)
HGB: 10.7 g/dL — ABNORMAL LOW (ref 13.0–18.0)
LYMPHS ABS: 0.5 10*3/uL — AB (ref 1.0–3.6)
Lymphocyte %: 4.1 %
MCH: 30.7 pg (ref 26.0–34.0)
MCHC: 33.5 g/dL (ref 32.0–36.0)
MCV: 92 fL (ref 80–100)
MONO ABS: 0.8 x10 3/mm (ref 0.2–1.0)
Monocyte %: 6.3 %
NEUTROS ABS: 10.9 10*3/uL — AB (ref 1.4–6.5)
Neutrophil %: 89.2 %
Platelet: 121 10*3/uL — ABNORMAL LOW (ref 150–440)
RBC: 3.48 10*6/uL — AB (ref 4.40–5.90)
RDW: 14.8 % — ABNORMAL HIGH (ref 11.5–14.5)
WBC: 12.2 10*3/uL — AB (ref 3.8–10.6)

## 2013-07-11 LAB — BASIC METABOLIC PANEL
Anion Gap: 2 — ABNORMAL LOW (ref 7–16)
BUN: 13 mg/dL (ref 7–18)
Calcium, Total: 7.6 mg/dL — ABNORMAL LOW (ref 8.5–10.1)
Chloride: 107 mmol/L (ref 98–107)
Co2: 27 mmol/L (ref 21–32)
Creatinine: 0.85 mg/dL (ref 0.60–1.30)
Glucose: 116 mg/dL — ABNORMAL HIGH (ref 65–99)
Osmolality: 273 (ref 275–301)
Potassium: 4.3 mmol/L (ref 3.5–5.1)
SODIUM: 136 mmol/L (ref 136–145)

## 2013-07-11 LAB — URINE CULTURE

## 2013-07-11 LAB — CREATININE, SERUM
CREATININE: 0.64 mg/dL (ref 0.60–1.30)
EGFR (Non-African Amer.): >60

## 2013-07-12 LAB — PATHOLOGY REPORT

## 2013-07-14 LAB — CULTURE, BLOOD (SINGLE)

## 2013-07-15 ENCOUNTER — Emergency Department: Payer: Self-pay | Admitting: Emergency Medicine

## 2013-07-15 LAB — URINALYSIS, COMPLETE
BLOOD: NEGATIVE
Bilirubin,UR: NEGATIVE
Glucose,UR: NEGATIVE mg/dL (ref 0–75)
Ketone: NEGATIVE
Leukocyte Esterase: NEGATIVE
NITRITE: NEGATIVE
PROTEIN: NEGATIVE
Ph: 5 (ref 4.5–8.0)
Specific Gravity: 1.038 (ref 1.003–1.030)
Squamous Epithelial: 1
WBC UR: 2 /HPF (ref 0–5)

## 2013-07-15 LAB — COMPREHENSIVE METABOLIC PANEL
ALBUMIN: 2.6 g/dL — AB (ref 3.4–5.0)
Alkaline Phosphatase: 85 U/L
Anion Gap: 3 — ABNORMAL LOW (ref 7–16)
BUN: 20 mg/dL — ABNORMAL HIGH (ref 7–18)
Bilirubin,Total: 0.4 mg/dL (ref 0.2–1.0)
CALCIUM: 9 mg/dL (ref 8.5–10.1)
CHLORIDE: 105 mmol/L (ref 98–107)
CO2: 32 mmol/L (ref 21–32)
Creatinine: 0.92 mg/dL (ref 0.60–1.30)
EGFR (African American): >60
EGFR (Non-African Amer.): >60
GLUCOSE: 120 mg/dL — AB (ref 65–99)
Osmolality: 283 (ref 275–301)
Potassium: 3.7 mmol/L (ref 3.5–5.1)
SGOT(AST): 19 U/L (ref 15–37)
SGPT (ALT): 19 U/L (ref 12–78)
Sodium: 140 mmol/L (ref 136–145)
Total Protein: 7.3 g/dL (ref 6.4–8.2)

## 2013-07-15 LAB — CBC WITH DIFFERENTIAL/PLATELET
BASOS PCT: 0.3 %
Basophil #: 0 10*3/uL (ref 0.0–0.1)
EOS PCT: 2.5 %
Eosinophil #: 0.3 10*3/uL (ref 0.0–0.7)
HCT: 33.2 % — AB (ref 40.0–52.0)
HGB: 10.8 g/dL — ABNORMAL LOW (ref 13.0–18.0)
Lymphocyte #: 1.1 10*3/uL (ref 1.0–3.6)
Lymphocyte %: 8.3 %
MCH: 29.9 pg (ref 26.0–34.0)
MCHC: 32.5 g/dL (ref 32.0–36.0)
MCV: 92 fL (ref 80–100)
MONO ABS: 1.3 x10 3/mm — AB (ref 0.2–1.0)
Monocyte %: 9.8 %
NEUTROS PCT: 79.1 %
Neutrophil #: 10.5 10*3/uL — ABNORMAL HIGH (ref 1.4–6.5)
PLATELETS: 244 10*3/uL (ref 150–440)
RBC: 3.62 10*6/uL — ABNORMAL LOW (ref 4.40–5.90)
RDW: 15.1 % — ABNORMAL HIGH (ref 11.5–14.5)
WBC: 13.2 10*3/uL — ABNORMAL HIGH (ref 3.8–10.6)

## 2013-07-15 LAB — LIPASE, BLOOD: LIPASE: 74 U/L (ref 73–393)

## 2013-07-15 LAB — TROPONIN I

## 2013-07-15 LAB — CULTURE, BLOOD (SINGLE)

## 2013-07-26 ENCOUNTER — Telehealth: Payer: Self-pay

## 2013-07-26 NOTE — Telephone Encounter (Signed)
Decrease Carvedilol to 6.25 mg bid.

## 2013-07-26 NOTE — Telephone Encounter (Signed)
Patient stated that yesterday morning and he "felt drunk"  He went to see his pain management doctor and his blood pressure was 80/54  He stated that he has had these episodes of "feeling drunk" several times over the last few weeks  He passed out for several minutes in his car during one episode a few weeks ago  Patient states he has a blood pressure cuff at home and when he is "feeling good" his pressure is usually 110/70

## 2013-07-26 NOTE — Telephone Encounter (Signed)
No answer 6/30

## 2013-07-26 NOTE — Telephone Encounter (Signed)
Pt called and states yesterday he felt "like I was drunk", his BP was 80/54. Please call.

## 2013-07-27 MED ORDER — CARVEDILOL 6.25 MG PO TABS: 6.2500 mg | ORAL_TABLET | Freq: Two times a day (BID) | ORAL | Status: DC

## 2013-07-27 NOTE — Telephone Encounter (Signed)
Instructed patient per Dr. Kirke CorinArida:  Decrease Coreg to 6.25 mg twice daily  Continue to monitor blood pressure  Call if you continue to have similar episodes  Notify EMS if he feels his situation becomes emergent   Patient verbalized understanding

## 2013-07-31 LAB — URINALYSIS, COMPLETE
Bilirubin,UR: NEGATIVE
Glucose,UR: NEGATIVE mg/dL (ref 0–75)
KETONE: NEGATIVE
LEUKOCYTE ESTERASE: NEGATIVE
Nitrite: NEGATIVE
Ph: 5 (ref 4.5–8.0)
Specific Gravity: 1.024 (ref 1.003–1.030)

## 2013-07-31 LAB — COMPREHENSIVE METABOLIC PANEL
ANION GAP: 5 — AB (ref 7–16)
Albumin: 3.2 g/dL — ABNORMAL LOW (ref 3.4–5.0)
Alkaline Phosphatase: 110 U/L
BILIRUBIN TOTAL: 0.5 mg/dL (ref 0.2–1.0)
BUN: 19 mg/dL — AB (ref 7–18)
CO2: 31 mmol/L (ref 21–32)
Calcium, Total: 9.5 mg/dL (ref 8.5–10.1)
Chloride: 102 mmol/L (ref 98–107)
Creatinine: 1 mg/dL (ref 0.60–1.30)
EGFR (Non-African Amer.): >60
Glucose: 96 mg/dL (ref 65–99)
Osmolality: 278 (ref 275–301)
POTASSIUM: 4.6 mmol/L (ref 3.5–5.1)
SGOT(AST): 24 U/L (ref 15–37)
SGPT (ALT): 19 U/L (ref 12–78)
Sodium: 138 mmol/L (ref 136–145)
Total Protein: 8.3 g/dL — ABNORMAL HIGH (ref 6.4–8.2)

## 2013-07-31 LAB — CBC
HCT: 34.7 % — AB (ref 40.0–52.0)
HGB: 11.6 g/dL — ABNORMAL LOW (ref 13.0–18.0)
MCH: 29.6 pg (ref 26.0–34.0)
MCHC: 33.3 g/dL (ref 32.0–36.0)
MCV: 89 fL (ref 80–100)
Platelet: 224 10*3/uL (ref 150–440)
RBC: 3.9 10*6/uL — ABNORMAL LOW (ref 4.40–5.90)
RDW: 14.4 % (ref 11.5–14.5)
WBC: 9.7 10*3/uL (ref 3.8–10.6)

## 2013-07-31 LAB — LIPASE, BLOOD: LIPASE: 95 U/L (ref 73–393)

## 2013-08-01 ENCOUNTER — Inpatient Hospital Stay: Payer: Self-pay | Admitting: Internal Medicine

## 2013-08-02 LAB — CBC WITH DIFFERENTIAL/PLATELET
Basophil #: 0 10*3/uL (ref 0.0–0.1)
Basophil %: 0.5 %
Eosinophil #: 0.2 10*3/uL (ref 0.0–0.7)
Eosinophil %: 2.7 %
HCT: 29.8 % — ABNORMAL LOW (ref 40.0–52.0)
HGB: 10 g/dL — ABNORMAL LOW (ref 13.0–18.0)
LYMPHS ABS: 1.4 10*3/uL (ref 1.0–3.6)
LYMPHS PCT: 21.7 %
MCH: 29.8 pg (ref 26.0–34.0)
MCHC: 33.4 g/dL (ref 32.0–36.0)
MCV: 89 fL (ref 80–100)
MONOS PCT: 12.4 %
Monocyte #: 0.8 x10 3/mm (ref 0.2–1.0)
Neutrophil #: 3.9 10*3/uL (ref 1.4–6.5)
Neutrophil %: 62.7 %
PLATELETS: 168 10*3/uL (ref 150–440)
RBC: 3.35 10*6/uL — ABNORMAL LOW (ref 4.40–5.90)
RDW: 14.5 % (ref 11.5–14.5)
WBC: 6.3 10*3/uL (ref 3.8–10.6)

## 2013-08-03 LAB — BASIC METABOLIC PANEL
ANION GAP: 9 (ref 7–16)
BUN: 8 mg/dL (ref 7–18)
Calcium, Total: 8.4 mg/dL — ABNORMAL LOW (ref 8.5–10.1)
Chloride: 103 mmol/L (ref 98–107)
Co2: 24 mmol/L (ref 21–32)
Creatinine: 0.46 mg/dL — ABNORMAL LOW (ref 0.60–1.30)
EGFR (Non-African Amer.): >60
GLUCOSE: 77 mg/dL (ref 65–99)
Osmolality: 269 (ref 275–301)
POTASSIUM: 4.5 mmol/L (ref 3.5–5.1)
SODIUM: 136 mmol/L (ref 136–145)

## 2013-08-06 ENCOUNTER — Ambulatory Visit: Payer: Self-pay | Admitting: Family Medicine

## 2013-08-06 LAB — CBC WITH DIFFERENTIAL/PLATELET
Basophil #: 0.1 10*3/uL (ref 0.0–0.1)
Basophil %: 1.1 %
EOS PCT: 6.8 %
Eosinophil #: 0.5 10*3/uL (ref 0.0–0.7)
HCT: 34.8 % — AB (ref 40.0–52.0)
HGB: 11.3 g/dL — ABNORMAL LOW (ref 13.0–18.0)
LYMPHS ABS: 1.5 10*3/uL (ref 1.0–3.6)
Lymphocyte %: 20.4 %
MCH: 29 pg (ref 26.0–34.0)
MCHC: 32.6 g/dL (ref 32.0–36.0)
MCV: 89 fL (ref 80–100)
MONO ABS: 0.6 x10 3/mm (ref 0.2–1.0)
Monocyte %: 8.4 %
Neutrophil #: 4.6 10*3/uL (ref 1.4–6.5)
Neutrophil %: 63.3 %
Platelet: 226 10*3/uL (ref 150–440)
RBC: 3.91 10*6/uL — AB (ref 4.40–5.90)
RDW: 14.4 % (ref 11.5–14.5)
WBC: 7.2 10*3/uL (ref 3.8–10.6)

## 2013-08-06 LAB — URINALYSIS, COMPLETE
BILIRUBIN, UR: NEGATIVE
Glucose,UR: NEGATIVE mg/dL (ref 0–75)
KETONE: NEGATIVE
LEUKOCYTE ESTERASE: NEGATIVE
NITRITE: NEGATIVE
PH: 6 (ref 4.5–8.0)
SPECIFIC GRAVITY: 1.02 (ref 1.003–1.030)

## 2013-08-06 LAB — COMPREHENSIVE METABOLIC PANEL
ALBUMIN: 2.9 g/dL — AB (ref 3.4–5.0)
ALT: 27 U/L (ref 12–78)
ANION GAP: 7 (ref 7–16)
Alkaline Phosphatase: 98 U/L
BILIRUBIN TOTAL: 0.2 mg/dL (ref 0.2–1.0)
BUN: 22 mg/dL — AB (ref 7–18)
CHLORIDE: 101 mmol/L (ref 98–107)
CO2: 31 mmol/L (ref 21–32)
Calcium, Total: 8.9 mg/dL (ref 8.5–10.1)
Creatinine: 1.16 mg/dL (ref 0.60–1.30)
EGFR (African American): >60
Glucose: 129 mg/dL — ABNORMAL HIGH (ref 65–99)
Osmolality: 283 (ref 275–301)
Potassium: 3.5 mmol/L (ref 3.5–5.1)
SGOT(AST): 35 U/L (ref 15–37)
Sodium: 139 mmol/L (ref 136–145)
Total Protein: 7.8 g/dL (ref 6.4–8.2)

## 2013-08-08 LAB — URINE CULTURE

## 2013-08-18 ENCOUNTER — Other Ambulatory Visit: Payer: Self-pay | Admitting: *Deleted

## 2013-08-18 MED ORDER — CARVEDILOL 12.5 MG PO TABS: 12.5000 mg | ORAL_TABLET | Freq: Two times a day (BID) | ORAL | Status: DC

## 2013-08-18 MED ORDER — ENALAPRIL MALEATE 5 MG PO TABS: 5.0000 mg | ORAL_TABLET | Freq: Every day | ORAL | Status: DC

## 2013-08-25 ENCOUNTER — Telehealth: Payer: Self-pay

## 2013-08-25 NOTE — Telephone Encounter (Signed)
Patient called and stated that when he is in pain his blood pressure is 160/90 at times  When he is not in pain it is around 90/70 He is no longer participating in the pain management clinic and his PCP is out of town  He wants to know if Dr. Kirke CorinArida can prescribe a small amount of pain medication

## 2013-08-25 NOTE — Telephone Encounter (Signed)
Informed patient per Dr. Kirke CorinArida (verbal) that he can not give him a prescription for his pain medications at this time  Patient verbalized understanding

## 2013-08-25 NOTE — Telephone Encounter (Signed)
Pt states "something is going on with me" States his BP goes up when he is in pain, and goes low when he has no pain. States he has a headache. Tues night 86/72, yesterday 145/95. Please call.

## 2013-08-29 ENCOUNTER — Emergency Department: Payer: Self-pay | Admitting: Emergency Medicine

## 2013-08-29 LAB — COMPREHENSIVE METABOLIC PANEL
ALBUMIN: 3.5 g/dL (ref 3.4–5.0)
ALK PHOS: 127 U/L — AB
ALT: 19 U/L
ANION GAP: 8 (ref 7–16)
BILIRUBIN TOTAL: 0.2 mg/dL (ref 0.2–1.0)
BUN: 16 mg/dL (ref 7–18)
CALCIUM: 9.4 mg/dL (ref 8.5–10.1)
CO2: 25 mmol/L (ref 21–32)
Chloride: 106 mmol/L (ref 98–107)
Creatinine: 0.79 mg/dL (ref 0.60–1.30)
EGFR (African American): >60
GLUCOSE: 124 mg/dL — AB (ref 65–99)
OSMOLALITY: 280 (ref 275–301)
Potassium: 3.9 mmol/L (ref 3.5–5.1)
SGOT(AST): 18 U/L (ref 15–37)
SODIUM: 139 mmol/L (ref 136–145)
Total Protein: 7.9 g/dL (ref 6.4–8.2)

## 2013-08-29 LAB — URINALYSIS, COMPLETE
BACTERIA: NONE SEEN
Bilirubin,UR: NEGATIVE
Glucose,UR: 50 mg/dL (ref 0–75)
KETONE: NEGATIVE
LEUKOCYTE ESTERASE: NEGATIVE
NITRITE: NEGATIVE
Ph: 5 (ref 4.5–8.0)
Protein: NEGATIVE
RBC,UR: 14 /HPF (ref 0–5)
SPECIFIC GRAVITY: 1.028 (ref 1.003–1.030)
SQUAMOUS EPITHELIAL: NONE SEEN

## 2013-08-29 LAB — CBC WITH DIFFERENTIAL/PLATELET
BASOS PCT: 0.4 %
Basophil #: 0 10*3/uL (ref 0.0–0.1)
EOS ABS: 0.2 10*3/uL (ref 0.0–0.7)
Eosinophil %: 2.1 %
HCT: 40.4 % (ref 40.0–52.0)
HGB: 13.2 g/dL (ref 13.0–18.0)
Lymphocyte #: 1.2 10*3/uL (ref 1.0–3.6)
Lymphocyte %: 13.5 %
MCH: 28.6 pg (ref 26.0–34.0)
MCHC: 32.8 g/dL (ref 32.0–36.0)
MCV: 87 fL (ref 80–100)
MONOS PCT: 7.7 %
Monocyte #: 0.7 x10 3/mm (ref 0.2–1.0)
NEUTROS ABS: 6.7 10*3/uL — AB (ref 1.4–6.5)
NEUTROS PCT: 76.3 %
Platelet: 205 10*3/uL (ref 150–440)
RBC: 4.63 10*6/uL (ref 4.40–5.90)
RDW: 14.5 % (ref 11.5–14.5)
WBC: 8.8 10*3/uL (ref 3.8–10.6)

## 2013-09-06 ENCOUNTER — Observation Stay: Payer: Self-pay | Admitting: Surgery

## 2013-09-06 LAB — URINALYSIS, COMPLETE
Bacteria: NONE SEEN
Bilirubin,UR: NEGATIVE
GLUCOSE, UR: NEGATIVE mg/dL (ref 0–75)
KETONE: NEGATIVE
Leukocyte Esterase: NEGATIVE
Nitrite: NEGATIVE
PH: 5 (ref 4.5–8.0)
Protein: NEGATIVE
RBC,UR: 1 /HPF (ref 0–5)
SPECIFIC GRAVITY: 1.017 (ref 1.003–1.030)
SQUAMOUS EPITHELIAL: NONE SEEN

## 2013-09-06 LAB — COMPREHENSIVE METABOLIC PANEL
ALK PHOS: 120 U/L — AB
ANION GAP: 6 — AB (ref 7–16)
AST: 20 U/L (ref 15–37)
Albumin: 3.3 g/dL — ABNORMAL LOW (ref 3.4–5.0)
BUN: 14 mg/dL (ref 7–18)
Bilirubin,Total: 0.4 mg/dL (ref 0.2–1.0)
CALCIUM: 9 mg/dL (ref 8.5–10.1)
CO2: 27 mmol/L (ref 21–32)
Chloride: 108 mmol/L — ABNORMAL HIGH (ref 98–107)
Creatinine: 0.81 mg/dL (ref 0.60–1.30)
EGFR (African American): >60
EGFR (Non-African Amer.): >60
Glucose: 142 mg/dL — ABNORMAL HIGH (ref 65–99)
OSMOLALITY: 284 (ref 275–301)
POTASSIUM: 3.9 mmol/L (ref 3.5–5.1)
SGPT (ALT): 16 U/L
SODIUM: 141 mmol/L (ref 136–145)
TOTAL PROTEIN: 8.5 g/dL — AB (ref 6.4–8.2)

## 2013-09-06 LAB — OCCULT BLOOD X 1 CARD TO LAB, STOOL: Occult Blood, Feces: NEGATIVE

## 2013-09-06 LAB — CBC WITH DIFFERENTIAL/PLATELET
BASOS PCT: 0.6 %
Basophil #: 0.1 10*3/uL (ref 0.0–0.1)
EOS PCT: 1.3 %
Eosinophil #: 0.2 10*3/uL (ref 0.0–0.7)
HCT: 41.6 % (ref 40.0–52.0)
HGB: 13 g/dL (ref 13.0–18.0)
LYMPHS PCT: 8.6 %
Lymphocyte #: 1 10*3/uL (ref 1.0–3.6)
MCH: 27.4 pg (ref 26.0–34.0)
MCHC: 31.3 g/dL — ABNORMAL LOW (ref 32.0–36.0)
MCV: 88 fL (ref 80–100)
MONO ABS: 0.7 x10 3/mm (ref 0.2–1.0)
Monocyte %: 5.5 %
NEUTROS PCT: 84 %
Neutrophil #: 10.1 10*3/uL — ABNORMAL HIGH (ref 1.4–6.5)
Platelet: 252 10*3/uL (ref 150–440)
RBC: 4.75 10*6/uL (ref 4.40–5.90)
RDW: 14.8 % — ABNORMAL HIGH (ref 11.5–14.5)
WBC: 12 10*3/uL — ABNORMAL HIGH (ref 3.8–10.6)

## 2013-09-07 LAB — BASIC METABOLIC PANEL
Anion Gap: 4 — ABNORMAL LOW (ref 7–16)
BUN: 18 mg/dL (ref 7–18)
Calcium, Total: 8.6 mg/dL (ref 8.5–10.1)
Chloride: 106 mmol/L (ref 98–107)
Co2: 29 mmol/L (ref 21–32)
Creatinine: 0.95 mg/dL (ref 0.60–1.30)
EGFR (Non-African Amer.): >60
Glucose: 126 mg/dL — ABNORMAL HIGH (ref 65–99)
Osmolality: 281 (ref 275–301)
Potassium: 3.4 mmol/L — ABNORMAL LOW (ref 3.5–5.1)
Sodium: 139 mmol/L (ref 136–145)

## 2013-09-07 LAB — CBC WITH DIFFERENTIAL/PLATELET
BASOS PCT: 0.4 %
Basophil #: 0 10*3/uL (ref 0.0–0.1)
EOS ABS: 0.3 10*3/uL (ref 0.0–0.7)
Eosinophil %: 3.8 %
HCT: 35.8 % — AB (ref 40.0–52.0)
HGB: 11.5 g/dL — ABNORMAL LOW (ref 13.0–18.0)
Lymphocyte #: 1.6 10*3/uL (ref 1.0–3.6)
Lymphocyte %: 20.4 %
MCH: 28.1 pg (ref 26.0–34.0)
MCHC: 32.1 g/dL (ref 32.0–36.0)
MCV: 88 fL (ref 80–100)
Monocyte #: 0.7 x10 3/mm (ref 0.2–1.0)
Monocyte %: 9.1 %
NEUTROS ABS: 5.2 10*3/uL (ref 1.4–6.5)
Neutrophil %: 66.3 %
PLATELETS: 198 10*3/uL (ref 150–440)
RBC: 4.08 10*6/uL — ABNORMAL LOW (ref 4.40–5.90)
RDW: 14.6 % — ABNORMAL HIGH (ref 11.5–14.5)
WBC: 7.9 10*3/uL (ref 3.8–10.6)

## 2013-09-08 LAB — CLOSTRIDIUM DIFFICILE(ARMC)

## 2013-09-09 LAB — STOOL CULTURE

## 2013-09-23 ENCOUNTER — Ambulatory Visit: Payer: Self-pay | Admitting: Internal Medicine

## 2013-09-23 LAB — CBC CANCER CENTER
Basophil #: 0.1 x10 3/mm (ref 0.0–0.1)
Basophil %: 0.8 %
Eosinophil #: 0.3 x10 3/mm (ref 0.0–0.7)
Eosinophil %: 4 %
HCT: 36 % — AB (ref 40.0–52.0)
HGB: 11.7 g/dL — ABNORMAL LOW (ref 13.0–18.0)
Lymphocyte #: 1.7 x10 3/mm (ref 1.0–3.6)
Lymphocyte %: 23.7 %
MCH: 28.5 pg (ref 26.0–34.0)
MCHC: 32.5 g/dL (ref 32.0–36.0)
MCV: 88 fL (ref 80–100)
MONO ABS: 0.6 x10 3/mm (ref 0.2–1.0)
Monocyte %: 9 %
Neutrophil #: 4.4 x10 3/mm (ref 1.4–6.5)
Neutrophil %: 62.5 %
Platelet: 210 x10 3/mm (ref 150–440)
RBC: 4.1 10*6/uL — ABNORMAL LOW (ref 4.40–5.90)
RDW: 15.5 % — ABNORMAL HIGH (ref 11.5–14.5)
WBC: 7 x10 3/mm (ref 3.8–10.6)

## 2013-09-23 LAB — IRON AND TIBC
Iron Bind.Cap.(Total): 311 ug/dL (ref 250–450)
Iron Saturation: 13 %
Iron: 41 ug/dL — ABNORMAL LOW (ref 65–175)
UNBOUND IRON-BIND. CAP.: 270 ug/dL

## 2013-09-23 LAB — FERRITIN: Ferritin (ARMC): 46 ng/mL (ref 8–388)

## 2013-09-27 ENCOUNTER — Ambulatory Visit: Payer: Self-pay | Admitting: Internal Medicine

## 2013-10-27 ENCOUNTER — Ambulatory Visit: Payer: Self-pay | Admitting: Internal Medicine

## 2013-11-27 ENCOUNTER — Encounter: Payer: Self-pay | Admitting: Cardiology

## 2013-11-27 ENCOUNTER — Inpatient Hospital Stay: Payer: Self-pay | Admitting: Internal Medicine

## 2013-11-27 DIAGNOSIS — I10 Essential (primary) hypertension: Secondary | ICD-10-CM

## 2013-11-27 DIAGNOSIS — I5033 Acute on chronic diastolic (congestive) heart failure: Secondary | ICD-10-CM

## 2013-11-27 DIAGNOSIS — I214 Non-ST elevation (NSTEMI) myocardial infarction: Secondary | ICD-10-CM

## 2013-11-27 LAB — BASIC METABOLIC PANEL
Anion Gap: 5 — ABNORMAL LOW (ref 7–16)
BUN: 11 mg/dL (ref 7–18)
CALCIUM: 8.8 mg/dL (ref 8.5–10.1)
CHLORIDE: 106 mmol/L (ref 98–107)
Co2: 31 mmol/L (ref 21–32)
Creatinine: 0.75 mg/dL (ref 0.60–1.30)
EGFR (African American): >60
Glucose: 107 mg/dL — ABNORMAL HIGH (ref 65–99)
OSMOLALITY: 283 (ref 275–301)
POTASSIUM: 4 mmol/L (ref 3.5–5.1)
Sodium: 142 mmol/L (ref 136–145)

## 2013-11-27 LAB — TROPONIN I
Troponin-I: 3.1 ng/mL — ABNORMAL HIGH
Troponin-I: 3.7 ng/mL — ABNORMAL HIGH

## 2013-11-27 LAB — CBC
HCT: 42.1 % (ref 40.0–52.0)
HGB: 14 g/dL (ref 13.0–18.0)
MCH: 29.1 pg (ref 26.0–34.0)
MCHC: 33.2 g/dL (ref 32.0–36.0)
MCV: 87 fL (ref 80–100)
Platelet: 191 10*3/uL (ref 150–440)
RBC: 4.82 10*6/uL (ref 4.40–5.90)
RDW: 15.9 % — ABNORMAL HIGH (ref 11.5–14.5)
WBC: 11.7 10*3/uL — ABNORMAL HIGH (ref 3.8–10.6)

## 2013-11-27 LAB — APTT: ACTIVATED PTT: 33.2 s (ref 23.6–35.9)

## 2013-11-27 LAB — PROTIME-INR
INR: 1
PROTHROMBIN TIME: 13.4 s (ref 11.5–14.7)

## 2013-11-27 LAB — CK-MB
CK-MB: 10.9 ng/mL — ABNORMAL HIGH (ref 0.5–3.6)
CK-MB: 10.1 ng/mL — AB (ref 0.5–3.6)

## 2013-11-27 LAB — PRO B NATRIURETIC PEPTIDE: B-Type Natriuretic Peptide: 4581 pg/mL — ABNORMAL HIGH (ref 0–125)

## 2013-11-28 ENCOUNTER — Encounter (HOSPITAL_COMMUNITY)
Admission: EM | Disposition: A | Discharge status: Home / Self Care | Payer: Self-pay | Source: Other Acute Inpatient Hospital | Attending: Cardiovascular Disease

## 2013-11-28 ENCOUNTER — Inpatient Hospital Stay (HOSPITAL_COMMUNITY): Payer: Medicaid Other

## 2013-11-28 ENCOUNTER — Inpatient Hospital Stay (HOSPITAL_COMMUNITY)
Admission: EM | Admit: 2013-11-28 | Discharge: 2013-12-01 | DRG: 280 | Disposition: A | Discharge status: Home / Self Care | Payer: Medicaid Other | Source: Other Acute Inpatient Hospital | Attending: Cardiovascular Disease | Admitting: Cardiovascular Disease

## 2013-11-28 DIAGNOSIS — I2121 ST elevation (STEMI) myocardial infarction involving left circumflex coronary artery: Secondary | ICD-10-CM

## 2013-11-28 DIAGNOSIS — Z7902 Long term (current) use of antithrombotics/antiplatelets: Secondary | ICD-10-CM | POA: Diagnosis not present

## 2013-11-28 DIAGNOSIS — J9601 Acute respiratory failure with hypoxia: Secondary | ICD-10-CM | POA: Diagnosis present

## 2013-11-28 DIAGNOSIS — E876 Hypokalemia: Secondary | ICD-10-CM | POA: Diagnosis not present

## 2013-11-28 DIAGNOSIS — I2510 Atherosclerotic heart disease of native coronary artery without angina pectoris: Secondary | ICD-10-CM | POA: Diagnosis present

## 2013-11-28 DIAGNOSIS — I2582 Chronic total occlusion of coronary artery: Secondary | ICD-10-CM | POA: Diagnosis present

## 2013-11-28 DIAGNOSIS — Z7982 Long term (current) use of aspirin: Secondary | ICD-10-CM

## 2013-11-28 DIAGNOSIS — D649 Anemia, unspecified: Secondary | ICD-10-CM | POA: Diagnosis present

## 2013-11-28 DIAGNOSIS — G9349 Other encephalopathy: Secondary | ICD-10-CM | POA: Diagnosis present

## 2013-11-28 DIAGNOSIS — J9610 Chronic respiratory failure, unspecified whether with hypoxia or hypercapnia: Secondary | ICD-10-CM

## 2013-11-28 DIAGNOSIS — I10 Essential (primary) hypertension: Secondary | ICD-10-CM | POA: Diagnosis present

## 2013-11-28 DIAGNOSIS — Z79899 Other long term (current) drug therapy: Secondary | ICD-10-CM | POA: Diagnosis not present

## 2013-11-28 DIAGNOSIS — Z4659 Encounter for fitting and adjustment of other gastrointestinal appliance and device: Secondary | ICD-10-CM

## 2013-11-28 DIAGNOSIS — R570 Cardiogenic shock: Secondary | ICD-10-CM | POA: Diagnosis present

## 2013-11-28 DIAGNOSIS — E785 Hyperlipidemia, unspecified: Secondary | ICD-10-CM | POA: Diagnosis not present

## 2013-11-28 DIAGNOSIS — M549 Dorsalgia, unspecified: Secondary | ICD-10-CM | POA: Diagnosis not present

## 2013-11-28 DIAGNOSIS — I5041 Acute combined systolic (congestive) and diastolic (congestive) heart failure: Principal | ICD-10-CM

## 2013-11-28 DIAGNOSIS — I213 ST elevation (STEMI) myocardial infarction of unspecified site: Secondary | ICD-10-CM

## 2013-11-28 DIAGNOSIS — I472 Ventricular tachycardia: Secondary | ICD-10-CM | POA: Diagnosis not present

## 2013-11-28 DIAGNOSIS — J9690 Respiratory failure, unspecified, unspecified whether with hypoxia or hypercapnia: Secondary | ICD-10-CM

## 2013-11-28 DIAGNOSIS — I214 Non-ST elevation (NSTEMI) myocardial infarction: Secondary | ICD-10-CM | POA: Diagnosis present

## 2013-11-28 DIAGNOSIS — J449 Chronic obstructive pulmonary disease, unspecified: Secondary | ICD-10-CM | POA: Diagnosis present

## 2013-11-28 DIAGNOSIS — R0600 Dyspnea, unspecified: Secondary | ICD-10-CM | POA: Diagnosis present

## 2013-11-28 DIAGNOSIS — G8929 Other chronic pain: Secondary | ICD-10-CM | POA: Diagnosis present

## 2013-11-28 DIAGNOSIS — J9801 Acute bronchospasm: Secondary | ICD-10-CM | POA: Diagnosis not present

## 2013-11-28 DIAGNOSIS — I252 Old myocardial infarction: Secondary | ICD-10-CM

## 2013-11-28 DIAGNOSIS — Z955 Presence of coronary angioplasty implant and graft: Secondary | ICD-10-CM

## 2013-11-28 DIAGNOSIS — R579 Shock, unspecified: Secondary | ICD-10-CM | POA: Diagnosis not present

## 2013-11-28 DIAGNOSIS — J9600 Acute respiratory failure, unspecified whether with hypoxia or hypercapnia: Secondary | ICD-10-CM | POA: Diagnosis present

## 2013-11-28 DIAGNOSIS — I5032 Chronic diastolic (congestive) heart failure: Secondary | ICD-10-CM

## 2013-11-28 DIAGNOSIS — F17210 Nicotine dependence, cigarettes, uncomplicated: Secondary | ICD-10-CM | POA: Diagnosis present

## 2013-11-28 DIAGNOSIS — I341 Nonrheumatic mitral (valve) prolapse: Secondary | ICD-10-CM | POA: Diagnosis not present

## 2013-11-28 DIAGNOSIS — I25119 Atherosclerotic heart disease of native coronary artery with unspecified angina pectoris: Secondary | ICD-10-CM | POA: Diagnosis not present

## 2013-11-28 HISTORY — PX: CARDIAC CATHETERIZATION: SHX172

## 2013-11-28 HISTORY — PX: LEFT HEART CATHETERIZATION WITH CORONARY ANGIOGRAM: SHX5451

## 2013-11-28 LAB — CBC WITH DIFFERENTIAL/PLATELET
Basophil #: 0.1 10*3/uL (ref 0.0–0.1)
Basophil %: 0.6 %
EOS ABS: 0.2 10*3/uL (ref 0.0–0.7)
Eosinophil %: 2 %
HCT: 38.8 % — AB (ref 40.0–52.0)
HGB: 12.9 g/dL — AB (ref 13.0–18.0)
LYMPHS ABS: 1.8 10*3/uL (ref 1.0–3.6)
Lymphocyte %: 18.4 %
MCH: 28.9 pg (ref 26.0–34.0)
MCHC: 33.2 g/dL (ref 32.0–36.0)
MCV: 87 fL (ref 80–100)
MONO ABS: 0.8 x10 3/mm (ref 0.2–1.0)
Monocyte %: 8.1 %
Neutrophil #: 6.9 10*3/uL — ABNORMAL HIGH (ref 1.4–6.5)
Neutrophil %: 70.9 %
Platelet: 170 10*3/uL (ref 150–440)
RBC: 4.47 10*6/uL (ref 4.40–5.90)
RDW: 16.1 % — AB (ref 11.5–14.5)
WBC: 9.7 10*3/uL (ref 3.8–10.6)

## 2013-11-28 LAB — BASIC METABOLIC PANEL
Anion Gap: 10 (ref 7–16)
Anion gap: 17 — ABNORMAL HIGH (ref 5–15)
BUN: 9 mg/dL (ref 7–18)
BUN: 10 mg/dL (ref 6–23)
CALCIUM: 8.3 mg/dL — AB (ref 8.5–10.1)
CALCIUM: 8.5 mg/dL (ref 8.4–10.5)
CHLORIDE: 107 mmol/L (ref 98–107)
CO2: 22 mEq/L (ref 19–32)
CREATININE: 0.71 mg/dL (ref 0.60–1.30)
Chloride: 100 mEq/L (ref 96–112)
Co2: 31 mmol/L (ref 21–32)
Creatinine, Ser: 0.67 mg/dL (ref 0.50–1.35)
EGFR (African American): >60
EGFR (Non-African Amer.): >60
GFR calc Af Amer: >90 mL/min (ref >90)
GFR calc non Af Amer: >90 mL/min (ref >90)
GLUCOSE: 158 mg/dL — AB (ref 70–99)
Glucose: 94 mg/dL (ref 65–99)
OSMOLALITY: 293 (ref 275–301)
Potassium: 3.7 mmol/L (ref 3.5–5.1)
Potassium: 3.9 mEq/L (ref 3.7–5.3)
SODIUM: 139 meq/L (ref 137–147)
Sodium: 148 mmol/L — ABNORMAL HIGH (ref 136–145)

## 2013-11-28 LAB — BLOOD GAS, ARTERIAL
Acid-base deficit: 0 mmol/L (ref 0.0–2.0)
Bicarbonate: 23.2 mEq/L (ref 20.0–24.0)
DRAWN BY: 418751
FIO2: 0.5 %
LHR: 22 {breaths}/min
MECHVT: 580 mL
O2 SAT: 99.4 %
PATIENT TEMPERATURE: 97.3
PEEP/CPAP: 5 cmH2O
TCO2: 24.2 mmol/L (ref 0–100)
pCO2 arterial: 31.2 mmHg — ABNORMAL LOW (ref 35.0–45.0)
pH, Arterial: 7.481 — ABNORMAL HIGH (ref 7.350–7.450)
pO2, Arterial: 158 mmHg — ABNORMAL HIGH (ref 80.0–100.0)

## 2013-11-28 LAB — POCT I-STAT 3, ART BLOOD GAS (G3+)
ACID-BASE DEFICIT: 1 mmol/L (ref 0.0–2.0)
BICARBONATE: 25.8 meq/L — AB (ref 20.0–24.0)
BICARBONATE: 24.1 meq/L — AB (ref 20.0–24.0)
O2 SAT: 100 %
O2 Saturation: 99 %
PCO2 ART: 50.5 mmHg — AB (ref 35.0–45.0)
PH ART: 7.445 (ref 7.350–7.450)
PO2 ART: 105 mmHg — AB (ref 80.0–100.0)
Patient temperature: 97
TCO2: 27 mmol/L (ref 0–100)
TCO2: 25 mmol/L (ref 0–100)
pCO2 arterial: 34.8 mmHg — ABNORMAL LOW (ref 35.0–45.0)
pH, Arterial: 7.315 — ABNORMAL LOW (ref 7.350–7.450)
pO2, Arterial: 241 mmHg — ABNORMAL HIGH (ref 80.0–100.0)

## 2013-11-28 LAB — CBC
HEMATOCRIT: 38 % — AB (ref 39.0–52.0)
Hemoglobin: 13.1 g/dL (ref 13.0–17.0)
MCH: 28.9 pg (ref 26.0–34.0)
MCHC: 34.5 g/dL (ref 30.0–36.0)
MCV: 83.7 fL (ref 78.0–100.0)
Platelets: 301 10*3/uL (ref 150–400)
RBC: 4.54 MIL/uL (ref 4.22–5.81)
RDW: 15.2 % (ref 11.5–15.5)
WBC: 21.9 10*3/uL — AB (ref 4.0–10.5)

## 2013-11-28 LAB — MRSA PCR SCREENING: MRSA by PCR: NEGATIVE

## 2013-11-28 LAB — POCT ACTIVATED CLOTTING TIME
ACTIVATED CLOTTING TIME: 191 s
Activated Clotting Time: 140 seconds

## 2013-11-28 LAB — TRIGLYCERIDES: TRIGLYCERIDES: 189 mg/dL — AB (ref <150)

## 2013-11-28 LAB — CK-MB: CK-MB: 8.6 ng/mL — AB (ref 0.5–3.6)

## 2013-11-28 LAB — TROPONIN I
TROPONIN-I: 3.9 ng/mL — AB
Troponin I: 3.09 ng/mL (ref <0.30)

## 2013-11-28 LAB — LACTIC ACID, PLASMA: LACTIC ACID, VENOUS: 1.3 mmol/L (ref 0.5–2.2)

## 2013-11-28 LAB — HEPARIN LEVEL (UNFRACTIONATED)
Anti-Xa(Unfractionated): 0.15 IU/mL — ABNORMAL LOW (ref 0.30–0.70)
Anti-Xa(Unfractionated): 0.3 IU/mL (ref 0.30–0.70)

## 2013-11-28 SURGERY — LEFT HEART CATHETERIZATION WITH CORONARY ANGIOGRAM
Anesthesia: LOCAL

## 2013-11-28 MED ORDER — ARFORMOTEROL TARTRATE 15 MCG/2ML IN NEBU
15.0000 ug | INHALATION_SOLUTION | Freq: Two times a day (BID) | RESPIRATORY_TRACT | Status: DC
  Administered 2013-11-29 – 2013-11-30 (×3): 15 ug via RESPIRATORY_TRACT
  Filled 2013-11-28 (×7): qty 2

## 2013-11-28 MED ORDER — SODIUM CHLORIDE 0.9 % IV SOLN: 250.0000 mL | INTRAVENOUS | Status: DC | PRN

## 2013-11-28 MED ORDER — SODIUM CHLORIDE 0.9 % IV SOLN
0.0000 ug/h | INTRAVENOUS | Status: DC
  Administered 2013-11-28: 100 ug/h via INTRAVENOUS
  Filled 2013-11-28 (×2): qty 50

## 2013-11-28 MED ORDER — MIDAZOLAM HCL 2 MG/2ML IJ SOLN
INTRAMUSCULAR | Status: AC
  Filled 2013-11-28: qty 2

## 2013-11-28 MED ORDER — ENOXAPARIN SODIUM 40 MG/0.4ML ~~LOC~~ SOLN
40.0000 mg | SUBCUTANEOUS | Status: DC
  Filled 2013-11-28: qty 0.4

## 2013-11-28 MED ORDER — BUDESONIDE 0.5 MG/2ML IN SUSP
0.5000 mg | Freq: Two times a day (BID) | RESPIRATORY_TRACT | Status: DC
  Administered 2013-11-29 – 2013-11-30 (×3): 0.5 mg via RESPIRATORY_TRACT
  Filled 2013-11-28 (×7): qty 2

## 2013-11-28 MED ORDER — NITROGLYCERIN 1 MG/10 ML FOR IR/CATH LAB
INTRA_ARTERIAL | Status: AC
  Filled 2013-11-28: qty 10

## 2013-11-28 MED ORDER — CHLORHEXIDINE GLUCONATE 0.12 % MT SOLN
15.0000 mL | Freq: Two times a day (BID) | OROMUCOSAL | Status: DC
  Administered 2013-11-28 – 2013-11-29 (×2): 15 mL via OROMUCOSAL
  Filled 2013-11-28 (×2): qty 15

## 2013-11-28 MED ORDER — NOREPINEPHRINE BITARTRATE 1 MG/ML IV SOLN
0.0000 ug/min | INTRAVENOUS | Status: DC
  Administered 2013-11-28: 16 ug/min via INTRAVENOUS
  Filled 2013-11-28: qty 4

## 2013-11-28 MED ORDER — IPRATROPIUM-ALBUTEROL 0.5-2.5 (3) MG/3ML IN SOLN
3.0000 mL | Freq: Four times a day (QID) | RESPIRATORY_TRACT | Status: DC
  Administered 2013-11-28 – 2013-11-29 (×2): 3 mL via RESPIRATORY_TRACT
  Filled 2013-11-28 (×2): qty 3

## 2013-11-28 MED ORDER — ASPIRIN 81 MG PO CHEW
81.0000 mg | CHEWABLE_TABLET | Freq: Every day | ORAL | Status: DC
  Administered 2013-11-28 – 2013-12-01 (×4): 81 mg via ORAL
  Filled 2013-11-28 (×4): qty 1

## 2013-11-28 MED ORDER — ONDANSETRON HCL 4 MG/2ML IJ SOLN: 4.0000 mg | Freq: Four times a day (QID) | INTRAMUSCULAR | Status: DC | PRN

## 2013-11-28 MED ORDER — ACETAMINOPHEN 325 MG PO TABS: 650.0000 mg | ORAL_TABLET | ORAL | Status: DC | PRN

## 2013-11-28 MED ORDER — SODIUM CHLORIDE 0.9 % IJ SOLN
3.0000 mL | INTRAMUSCULAR | Status: DC | PRN
Start: 2013-11-28 — End: 2013-12-01

## 2013-11-28 MED ORDER — NITROGLYCERIN 0.4 MG SL SUBL: 0.4000 mg | SUBLINGUAL_TABLET | SUBLINGUAL | Status: DC | PRN

## 2013-11-28 MED ORDER — HEPARIN SODIUM (PORCINE) 1000 UNIT/ML IJ SOLN
INTRAMUSCULAR | Status: AC
  Filled 2013-11-28: qty 1

## 2013-11-28 MED ORDER — NOREPINEPHRINE BITARTRATE 1 MG/ML IV SOLN
INTRAVENOUS | Status: AC
  Filled 2013-11-28: qty 4

## 2013-11-28 MED ORDER — BUDESONIDE 0.25 MG/2ML IN SUSP
0.5000 mg | Freq: Two times a day (BID) | RESPIRATORY_TRACT | Status: DC
  Filled 2013-11-28 (×2): qty 4

## 2013-11-28 MED ORDER — SODIUM CHLORIDE 0.9 % IV SOLN
INTRAVENOUS | Status: DC
  Administered 2013-11-28 – 2013-11-29 (×2): via INTRAVENOUS

## 2013-11-28 MED ORDER — PANTOPRAZOLE SODIUM 40 MG IV SOLR
40.0000 mg | Freq: Every day | INTRAVENOUS | Status: DC
  Administered 2013-11-29: 40 mg via INTRAVENOUS
  Filled 2013-11-28 (×2): qty 40

## 2013-11-28 MED ORDER — ZOLPIDEM TARTRATE 5 MG PO TABS: 5.0000 mg | ORAL_TABLET | Freq: Every evening | ORAL | Status: DC | PRN

## 2013-11-28 MED ORDER — PROPOFOL 10 MG/ML IV EMUL
0.0000 ug/kg/min | INTRAVENOUS | Status: DC
  Administered 2013-11-28: 50 ug/kg/min via INTRAVENOUS
  Administered 2013-11-29: 15 ug/kg/min via INTRAVENOUS
  Filled 2013-11-28 (×2): qty 100

## 2013-11-28 MED ORDER — SODIUM CHLORIDE 0.9 % IJ SOLN
3.0000 mL | Freq: Two times a day (BID) | INTRAMUSCULAR | Status: DC
  Administered 2013-11-28 – 2013-12-01 (×6): 3 mL via INTRAVENOUS

## 2013-11-28 MED ORDER — HEPARIN (PORCINE) IN NACL 2-0.9 UNIT/ML-% IJ SOLN
INTRAMUSCULAR | Status: AC
  Filled 2013-11-28: qty 1000

## 2013-11-28 MED ORDER — TICAGRELOR 90 MG PO TABS
90.0000 mg | ORAL_TABLET | Freq: Two times a day (BID) | ORAL | Status: DC
  Administered 2013-11-28 – 2013-12-01 (×6): 90 mg via ORAL
  Filled 2013-11-28 (×8): qty 1

## 2013-11-28 MED ORDER — FENTANYL CITRATE 0.05 MG/ML IJ SOLN
100.0000 ug | INTRAMUSCULAR | Status: DC | PRN
  Administered 2013-11-29: 100 ug via INTRAVENOUS

## 2013-11-28 MED ORDER — LIDOCAINE HCL (PF) 1 % IJ SOLN
INTRAMUSCULAR | Status: AC
  Filled 2013-11-28: qty 30

## 2013-11-28 MED ORDER — CETYLPYRIDINIUM CHLORIDE 0.05 % MT LIQD
7.0000 mL | Freq: Four times a day (QID) | OROMUCOSAL | Status: DC
  Administered 2013-11-29 (×2): 7 mL via OROMUCOSAL

## 2013-11-28 MED ORDER — ALPRAZOLAM 0.25 MG PO TABS: 0.2500 mg | ORAL_TABLET | Freq: Two times a day (BID) | ORAL | Status: DC | PRN

## 2013-11-28 MED ORDER — SODIUM CHLORIDE 0.9 % IV SOLN: INTRAVENOUS | Status: DC | PRN

## 2013-11-28 NOTE — CV Procedure (Signed)
Walter [name] is a 58 y.o. male    [id number] LOCATION:  FACILITY: [facility]  PHYSICIAN: Nanetta BattyJonathan Len Azeez, M.D. 04/14/1955   DATE OF PROCEDURE:  11/28/2013  DATE OF DISCHARGE:     CARDIAC CATHETERIZATION     History obtained from chart review.Walter [name] is a 58 year old Caucasian male with a history of CAD who underwent cardiac catheterization by Dr. Kirke CorinArida at [facility] today. Marland Kitchen. He had a occluded codominant RCA and high-grade codominant proximal mid circumflex disease. He underwent stenting of his circumflex with a 4 mm x 23 mm long Xience drug-eluting stent postdilated with a 4.5 mm balloon with an excellent angiographic result. Late this afternoon he became suddenly hypotensive, diaphoretic and short of breath. He had runs of ventricular tachycardia and ultimately required intubation and sedation. Because of concern about whether or not there was a technical problem with the stent the patient was urgently [facility] cardiac catheter[facility] to redefine his anatomy. A 2-D echocardiogram performed by Dr. Kirke CorinArida showed minimal mitral regurgitation.   PROCEDURE DESCRIPTION:   The patient was brought to the second floor [facility]iac cath lab in the postabsorptive state. He was premedicated with IV Versed and a propofol drip.Marland Kitchen. His right groin was prepped and shaved in usual sterile fashion. Xylocaine 1% was used for local anesthesia. A 6 French sheath was inserted into the right common femoral  artery using standard Seldinger technique. The patient received 3000 units  of heparin  intravenously.  Using a 6 [place] XB LAD 3/2 guide catheter and a pigtail catheter angiography was performed of the left coronary system. An LVEDP was also obtained. The plate was used for the entirety of the case (45 mL total). Retrograde aortic, left ventricular and pullback pressures were recorded.    HEMODYNAMICS:    AO SYSTOLIC/AO DIASTOLIC:    LV SYSTOLIC/LV DIASTOLIC:  80/12  ANGIOGRAPHIC RESULTS:   1. Left main; normal  2. LAD; I most 50% mid 3. Left circumflex; codominant with a widely patent proximal and mid circumflex stent.  4. Left ventriculography;not performed  IMPRESSION:Walter [name] has a widely patent circumflex stent, mild LAD disease and a low LVEDP. I am not sure why he went into pulmonary edema clinically nor why he was hypertensive. His blood pressure when he arrived was 40/20 and his nitroglycerin was discontinued. I've given him 250 mL of IV fluid bolus and put him on a leave of the drip for blood pressure. He also received 3000 units of heparin. I'm going to keep his femoral sheath in place until he is more thoroughly sedated.  Runell GessBERRY,Kennya Schwenn J. MD, [facility] 11/28/2013 7:41 PM

## 2013-11-28 NOTE — H&P (Signed)
History and Physical   Patient ID: Walter [name] MRN: [id number], DOB/AGE: 57/01/1955 57 y.o. Date of Encounter: 11/28/2013  Primary Physician: Luna FuseEJAN-SIE, SHEIKH AHMED, MD Primary Cardiologist: Dr. Kirke CorinArida  Chief Complaint:  STEMI  HPI: Walter RevelFranklin E Stebner is a 58 y.o. male with a history of CAD, hypertension, ongoing tobacco use, chronic back problems and anemia. Cardiac catheterization in 2013 showed a subtotal occlusion of the mid RCA with left-right collaterals and preserved left ventricular function. He was managed medically.  05/27/2013: Seen by Dr. Kirke CorinArida for chronic diastolic CHF, labile hypertension and CAD. He also has problems with chronic back pain, limiting his activity level. His weight at that time was 256. His cardiac status and volume status were stable.  11/27/2013: [facility] and had elevated troponins. He was admitted to CCU and his symptoms improved with nitroglycerin and IV heparin. His initial troponin was 3.1. He had inferior T-wave inversions and lateral ST depressions in V4-6 on his ECG. His chest x-ray showed no acute disease. He was on aspirin, heparin, high-dose statin and beta blocker.  He was seen by Dr. Herbie BaltimoreHarding who evaluated him and discussed  the risks and benefits of cardiac catheterization. He agreed to proceed.  He was taken to the cath lab on 11/28/2013 by Dr. Kirke CorinArida. Cardiac catheterization results showed chronic occlusion of the RCA with left-right collaterals. He had a 95% stenosis with plaque rupture in the mid circumflex that is felt to be the culprit lesion. LAD had a 50% stenosis. His EF was 45%. A Xience Alpine 4.0 x 23 mm DES was inserted and the circumflex without dissection or complication. He initially tolerated the procedure well.  After 5:00 today he began complaining of dyspnea. His heart rate was elevated. There was no problems with his right radial cath site. He was hypertensive with a blood pressure 165/92.  He was found to have bronchospasm and was treated with nebulizers. When necessary hydralazine was used for his blood pressure and he was started on Xanax as prior to admission.  His respiratory status this deteriorated to the chest x-ray showed possible pulmonary edema. His heart rate increased and he began having short runs of nonsustained VT.  His respiratory status worsened despite IV Lasix. He was intubated on the chest x-ray performed after the intubation showed the ET tube tip 3.6 cm above the carina, with CHF and possible right lower lobe edema or infiltrate.  Because of the deterioration in his respiratory status as well as the ECG changes, he was transferred to [facility] and taken to the cath lab. He was evaluated in in the cath lab. He is sedated on the ventilator and information was obtained from chart notes. No family is present.  Past Medical History  Diagnosis Date  . Hypertension   . Degenerative joint disease of spine     a. 11/2011 s/p L4-L5 fusion.  . Chronic back pain   . Chronic diastolic CHF (congestive heart failure)   . COPD (chronic obstructive pulmonary disease)   . CAD (coronary artery disease)     a. 11/2011 NSTEMI/Cath: subtotal occlusion of the mid RCA with L->R collats, nl LV fxn-->medical Rx;  . Nephrolithiasis   . Tobacco abuse     a. 80+ pack year hx  . Rupture of rotator cuff of shoulder     left  . Anemia     Surgical History:  Past Surgical History  Procedure Laterality Date  .  Shoulder surgery    . Knee surgery    . Wrist surgery    . Nose surgery    . Carpal tunnel release    . Kidney stone      removed  . L4-l5 spine surgery  2013  . Cardiac catheterization  12/15/2011    NSTEMI - Subtotalled RCA - Med Rx     I have reviewed the patient's current medications. Prior to Admission medications   Medication Sig Start Date End Date Taking? Authorizing Provider  acetaminophen (TYLENOL) 500 MG tablet Take 1,000 mg by mouth every 6 (six) hours  as needed. pain    Historical Provider, MD  albuterol (PROVENTIL HFA;VENTOLIN HFA) 108 (90 BASE) MCG/ACT inhaler Inhale 2 puffs into the lungs every 6 (six) hours as needed. Wheezing and shortness of breath    Historical Provider, MD  aspirin EC 81 MG tablet Take 81 mg by mouth once.    Historical Provider, MD  atorvastatin (LIPITOR) 40 MG tablet TAKE ONE TABLET EVERY EVENING 05/03/13   Iran Ouch, MD  Blood Pressure Monitoring (B-D ASSURE BPM/AUTO ARM CUFF) MISC 1 Device by Does not apply route daily. 03/01/12   Iran Ouch, MD  carvedilol (COREG) 12.5 MG tablet Take 1 tablet (12.5 mg total) by mouth 2 (two) times daily with a meal. 08/18/13   Iran Ouch, MD  enalapril (VASOTEC) 5 MG tablet Take 1 tablet (5 mg total) by mouth daily. 08/18/13   Iran Ouch, MD  Fluticasone-Salmeterol (ADVAIR DISKUS) 250-50 MCG/DOSE AEPB Inhale 1 puff into the lungs every 12 (twelve) hours.    Historical Provider, MD  furosemide (LASIX) 20 MG tablet Take 1 tablet (20 mg total) by mouth as needed. 07/05/12   Iran Ouch, MD  gabapentin (NEURONTIN) 800 MG tablet Take 800 mg by mouth 4 (four) times daily.    Historical Provider, MD  oxyCODONE-acetaminophen (PERCOCET) 10-325 MG per tablet Take 1 tablet by mouth as needed for pain.    Historical Provider, MD  tiotropium (SPIRIVA) 18 MCG inhalation capsule Place 18 mcg into inhaler and inhale daily.    Historical Provider, MD   Allergies:  Allergies  Allergen Reactions  . Ibuprofen   . Tramadol     History   Social History  . Marital Status: Single    Spouse Name: N/A    Number of Children: N/A  . Years of Education: N/A   Occupational History  . Not on file.   Social History Main Topics  . Smoking status: Current Every Day Smoker -- 0.50 packs/day for 42 years    Types: Cigarettes  . Smokeless tobacco: Not on file  . Alcohol Use: No     Comment: seldom  . Drug Use: No     Comment: past  . Sexual Activity: Not on file   Other  Topics Concern  . Not on file   Social History Narrative    Family History  Problem Relation Age of Onset  . Family history unknown: Yes    Review of Systems:   Not obtainable due to patient condition  Physical Exam: General: Well developed, well nourished,male sedated on the vent. Head: Normocephalic, atraumatic, sclera non-icteric, no xanthomas, nares are without discharge. Dentition: good Neck: No carotid bruits. JVD not elevated. No thyromegally Lungs: Good expansion bilaterally, without wheezes or rhonchi. Rales noted Heart: Regular rate and rhythm with S1 S2.  No S3 or S4.  No murmur, no rubs, or gallops appreciated. Abdomen: Soft, non-tender,  non-distended with bowel sounds present. No obvious abdominal masses. Msk:  Strength and tone not assessed due to patient condition. No joint deformities or effusions. Extremities: No clubbing or cyanosis. No edema.  Distal pedal pulses are 2+ in 4 extrem Neuro: sedated on the vent. Skin: No rashes or lesions noted  Labs: Lab Results  Component Value Date   sodium 148 11/27/2013   potassium 3.7     chloride 107    BUN 9     creatinine 0.71    glucose 94         wbc's 9.7    hemoglobin 12.9    hematocrit 38.8    platelets 170         BNP 4581    Troponin I 3.10    CK-MB 10.9              TRIG 255*      Radiology/Studies:  11/27/2013 chest x-ray No acute disease  11/28/2013 chest x-ray Post endotracheal intubation, tip 3.6 cm above the carina Stable cardiomegaly with progressive changes of acute CHF Progressive alveolar edema, pneumonia or aspiration pneumonitis in the right lower lobe   ECG: underlying sinus rhythm with frequent PVCs and short runs of nonsustained VT  ASSESSMENT AND PLAN:  Principal Problem:   Acute respiratory failure - We'll contact CCM for ventilator management. He has a history of COPD and this may be contributing but feel that the majority of his respiratory difficulties due to  CHF.  Active Problems:   Acute combined systolic and diastolic CHF, NYHA class 4 - diurese with IV Lasix, following renal function and daily weights as well as intake/output.    Non-Q wave ST elevation myocardial infarction (STEMI) involving left circumflex coronary artery - He is on aspirin, high-dose statin and beta blocker. He will benefit from afterload reduction and has a chronically occluded RCA so we will keep him on nitrates as well.    Hypertension - Follow, with diuresis, he should improve.    Chronic back pain - resume home medications once appropriate  Hold off on all oral home medications while he is intubated.  Melida QuitterSigned, Rhonda Barrett, PA-C 11/28/2013 7:42 PM Beeper [phone]  Agree with note by Theodore Demarkhonda Barrett PAC  Patient was [facility] from [facility] by Dr. Kirke CorinArida. because of hypotension, respiratory failure and ventricular tachycardia. He had a stent placed by Dr. Kirke CorinArida earlier that day. The right radial approach of his codominant circumflex. The concern was that there was a mechanical problem with his coronary anatomy leading to his decompensation. He was transferred for urgent cardiac catheterization.    'Runell GessJonathan J. Keston Seever, M.D., FACP, [facility] LakeFACC, Kathryne ErikssonFAHA, FSCAI [facility] [address] [place], [place]  [address]  [phone] 11/29/2013 5:03 PM

## 2013-11-28 NOTE — Consult Note (Signed)
PULMONARY / CRITICAL CARE MEDICINE   Name: Walter [name] MRN: [id number] DOB: 10/02/1955    ADMISSION DATE:  11/28/2013 CONSULTATION DATE:  11/28/2013  REFERRING MD :  Walter [name]  CHIEF COMPLAINT:  VDRF  INITIAL PRESENTATION: 58 y.o. M who underwent cardiac [facility] 11/2 where stent was placed into circumflex which had 95% stenosi.  Following procedure, had flash pulmonary edema leading to V.tach.  [facility].     STUDIES:  11/2 cardiac cath >>> chronic occlusion of RCA with left-right collaterals.  95% stenosis with plaque rupture mid circumflex thought to be culprit lesion.  DES placed in circumflex.  SIGNIFICANT EVENTS: 11/2 - cardiac cath with DES placed.  Later developed pulm edema.  [facility]ub[facility] and [facility] 11/2 - repeat cath, no new findings.  Coronaries all patent. Shock   HISTORY OF PRESENT ILLNESS:  Pt is encephalopathic; therefore, this HPI is obtained from chart review. Walter [name] is a 58 y.o. M with PMH as outlined below.  On 11/1, [facility].  He had elevated troponins and was admitted to CCU  Where his symptoms improved with nitro and heparin.  On 11/2, he underwent cardiac cath (Walter [name]).  Cath showed chronic occlusion of RCA with left-right collaterals.  He had 95% stenosis with plaque rupture in mid circ that was felt to be culprit lesion.  A DES was subsequently placed in the circumflex. Later that afternoon, he developed hypotension and acute dyspnea which worsened throughout afternoon.  Chest Xray revealed flash pulm edema and pt had a few runs of non-sustained V.tach. He was subsequently intubated and [facility].  Repeat cath performed that evening; however, no new findings noted. Pt returned to ICU on vent and Walter [name] was consulted.  PAST MEDICAL HISTORY :   has a past medical history of Hypertension; Degenerative joint disease of spine; Chronic back  pain; Chronic diastolic CHF (congestive heart failure); COPD (chronic obstructive pulmonary disease); CAD (coronary artery disease); Nephrolithiasis; Tobacco abuse; Rupture of rotator cuff of shoulder; and Anemia.  has past surgical history that includes Shoulder surgery; Knee surgery; Wrist surgery; Nose surgery; Carpal tunnel release; kidney stone; L4-L5 spine surgery (2013); and Cardiac catheterization (12/15/2011). Prior to Admission medications   Medication Sig Start Date End Date Taking? Authorizing Provider  acetaminophen (TYLENOL) 500 MG tablet Take 1,000 mg by mouth every 6 (six) hours as needed. pain    Historical Provider, MD  albuterol (PROVENTIL HFA;VENTOLIN HFA) 108 (90 BASE) MCG/ACT inhaler Inhale 2 puffs into the lungs every 6 (six) hours as needed. Wheezing and shortness of breath    Historical Provider, MD  aspirin EC 81 MG tablet Take 81 mg by mouth once.    Historical Provider, MD  atorvastatin (LIPITOR) 40 MG tablet TAKE ONE TABLET EVERY EVENING 05/03/13   Iran OuchMuhammad A Arida, MD  Blood Pressure Monitoring (B-D ASSURE BPM/AUTO ARM CUFF) MISC 1 Device by Does not apply route daily. 03/01/12   Iran OuchMuhammad A Arida, MD  carvedilol (COREG) 12.5 MG tablet Take 1 tablet (12.5 mg total) by mouth 2 (two) times daily with a meal. 08/18/13   Iran OuchMuhammad A Arida, MD  enalapril (VASOTEC) 5 MG tablet Take 1 tablet (5 mg total) by mouth daily. 08/18/13   Iran OuchMuhammad A Arida, MD  Fluticasone-Salmeterol (ADVAIR DISKUS) 250-50 MCG/DOSE AEPB Inhale 1 puff into the lungs every 12 (twelve) hours.    Historical Provider, MD  furosemide (LASIX) 20 MG tablet Take 1 tablet (20 mg total) by mouth as needed. 07/05/12   Iran Ouch, MD  gabapentin (NEURONTIN) 800 MG tablet Take 800 mg by mouth 4 (four) times daily.    Historical Provider, MD  oxyCODONE-acetaminophen (PERCOCET) 10-325 MG per tablet Take 1 tablet by mouth as needed for pain.    Historical Provider, MD  tiotropium (SPIRIVA) 18 MCG inhalation capsule Place 18  mcg into inhaler and inhale daily.    Historical Provider, MD   Allergies  Allergen Reactions  . Ibuprofen   . Tramadol     FAMILY HISTORY:  Family History  Problem Relation Age of Onset  . Family history unknown: Yes    SOCIAL HISTORY:  reports that he has been smoking Cigarettes.  He has a 21 pack-year smoking history. He does not have any smokeless tobacco history on file. He reports that he does not drink alcohol or use illicit drugs.  REVIEW OF SYSTEMS:  Unable to obtain as pt is encephalopathic.  SUBJECTIVE:   VITAL SIGNS:   HEMODYNAMICS:   VENTILATOR SETTINGS:   INTAKE / OUTPUT: Intake/Output    None     PHYSICAL EXAMINATION: General: Obese male on vent Neuro: A&O x 3, non-focal.  HEENT: [place]/AT. No JVD noted Cardiovascular: RRR, no M/R/G.  Lungs: Respirations even and unlabored.  CTA bilaterally, No W/R/R.  Abdomen: BS x 4, soft, NT/ND.  Musculoskeletal: No gross deformities, no edema.  Skin: Intact, warm, no rashes.  LABS:  CBC No results for input(s): WBC, HGB, HCT, PLT in the last 168 hours. Coag's No results for input(s): APTT, INR in the last 168 hours. BMET No results for input(s): NA, K, CL, CO2, BUN, CREATININE, GLUCOSE in the last 168 hours. Electrolytes No results for input(s): CALCIUM, MG, PHOS in the last 168 hours. Sepsis Markers No results for input(s): LATICACIDVEN, PROCALCITON, O2SATVEN in the last 168 hours. ABG  Recent Labs Lab 11/28/13 1919  PHART 7.315*  PCO2ART 50.5*  PO2ART 241.0*   Liver Enzymes No results for input(s): AST, ALT, ALKPHOS, BILITOT, ALBUMIN in the last 168 hours. Cardiac Enzymes No results for input(s): TROPONINI, PROBNP in the last 168 hours. Glucose No results for input(s): GLUCAP in the last 168 hours.  Imaging No results found.   ASSESSMENT / PLAN:  PULMONARY OETT 11/2 >>> A: Respiratory failure following cardiac catheterization 11/2 Flash pulmonary edema COPD Tobacco use disorder P:    Full mechanical support, wean as able. SBT in AM. DuoNebs / Budesonide / Rosalyn Gess. Hold outpatient spiriva, advair. ABG and CXR in AM.  CARDIOVASCULAR R femoral sheath 11/2 >>> A:  Shock of unknown etiology, cardiogenic most likely, but that workup negative so far.  CAD s/p cardiac cath and placement of DES to circumflex 11/2 CHF Hx HTN P:  MAP goal >32mm/Hg Levophed to achieve MAP goal Trend lactic Trend troponin Will likely need CVL, CVP monitoring Management per cardiology.  RENAL A:   No known issues  P:   NS @ KVO Repeat Bmet  GASTROINTESTINAL A:   GI prophylaxis Nutrition  P:   SUP: Pantoprazole. NPO. TF if remains NPO > 24 hours.  HEMATOLOGIC A:   No known issues VTE Prophylaxis  P:  SCD's / Lovenox Repeat CBC  INFECTIOUS A:   No known issues  P:   Monitor clinically  ENDOCRINE A:   No known issues   P:   CBG monitoring. Add SSI if glucose consistently > 180.   NEUROLOGIC A:  Acute encephalopathy 2nd to respiratory failure.   P:   Sedation:  Propofol, fentanyl gtt.  RASS goal: 0 to -1. Daily WUA.   Family updated:   Interdisciplinary Family Meeting v Palliative Care Meeting:  Due by: 11/9   TODAY'S SUMMARY: Intubated for respiratory failure 2nd to [facility]p cath with PCI. [facility] with no new findings. Now shocky, unknown etiology. Propofol, NTG, diuresis also counterproductive in this regard. Will manage on vent with levophed for time being. May need CVL if unable to wean off soon.   Joneen RoachPaul Hoffman, ACNP [facility] Pulmon[facility]/Critical [facility] Pager [phone] or [phone]

## 2013-11-28 NOTE — Progress Notes (Signed)
eLink Physician-Brief Progress Note Patient Name: Walter [name] DOB: 05/12/1955 MRN: [id number]   Date of Service  11/28/2013  HPI/Events of Note  Unclear cause of shock - nml EF on echo,? related to NTG gtt, now propofol gtt pulm edema - LVEDP low  eICU Interventions  Use fent gtt & minimise propofol If continues to require levo gtt, will place CVL   New ICU patient evaluation: [facility] I have reviewed relevant documentation including care plan & orders.   Intervention Category Evaluation Type: New Patient Evaluation  [name],Walter V. 11/28/2013, 8:12 PM

## 2013-11-29 ENCOUNTER — Encounter: Payer: Self-pay | Admitting: Cardiovascular Disease

## 2013-11-29 ENCOUNTER — Inpatient Hospital Stay (HOSPITAL_COMMUNITY): Payer: Medicaid Other

## 2013-11-29 ENCOUNTER — Ambulatory Visit: Payer: Self-pay | Admitting: Internal Medicine

## 2013-11-29 DIAGNOSIS — I2129 ST elevation (STEMI) myocardial infarction involving other sites: Secondary | ICD-10-CM

## 2013-11-29 DIAGNOSIS — I25119 Atherosclerotic heart disease of native coronary artery with unspecified angina pectoris: Secondary | ICD-10-CM

## 2013-11-29 DIAGNOSIS — I5041 Acute combined systolic (congestive) and diastolic (congestive) heart failure: Principal | ICD-10-CM

## 2013-11-29 DIAGNOSIS — J9600 Acute respiratory failure, unspecified whether with hypoxia or hypercapnia: Secondary | ICD-10-CM

## 2013-11-29 DIAGNOSIS — I10 Essential (primary) hypertension: Secondary | ICD-10-CM

## 2013-11-29 DIAGNOSIS — M549 Dorsalgia, unspecified: Secondary | ICD-10-CM

## 2013-11-29 DIAGNOSIS — R579 Shock, unspecified: Secondary | ICD-10-CM | POA: Insufficient documentation

## 2013-11-29 DIAGNOSIS — I509 Heart failure, unspecified: Secondary | ICD-10-CM

## 2013-11-29 DIAGNOSIS — J410 Simple chronic bronchitis: Secondary | ICD-10-CM

## 2013-11-29 DIAGNOSIS — I2121 ST elevation (STEMI) myocardial infarction involving left circumflex coronary artery: Secondary | ICD-10-CM

## 2013-11-29 DIAGNOSIS — G8929 Other chronic pain: Secondary | ICD-10-CM

## 2013-11-29 LAB — CBC
HCT: 35.7 % — ABNORMAL LOW (ref 39.0–52.0)
HEMOGLOBIN: 12.3 g/dL — AB (ref 13.0–17.0)
MCH: 28.8 pg (ref 26.0–34.0)
MCHC: 34.5 g/dL (ref 30.0–36.0)
MCV: 83.6 fL (ref 78.0–100.0)
PLATELETS: 201 10*3/uL (ref 150–400)
RBC: 4.27 MIL/uL (ref 4.22–5.81)
RDW: 15.3 % (ref 11.5–15.5)
WBC: 9.2 10*3/uL (ref 4.0–10.5)

## 2013-11-29 LAB — LIPID PANEL
CHOL/HDL RATIO: 4 ratio
Cholesterol: 127 mg/dL (ref 0–200)
HDL: 32 mg/dL — AB (ref >39)
LDL CALC: 63 mg/dL (ref 0–99)
Triglycerides: 161 mg/dL — ABNORMAL HIGH (ref <150)
VLDL: 32 mg/dL (ref 0–40)

## 2013-11-29 LAB — COMPREHENSIVE METABOLIC PANEL
ALK PHOS: 91 U/L (ref 39–117)
ALT: 9 U/L (ref 0–53)
ANION GAP: 15 (ref 5–15)
AST: 16 U/L (ref 0–37)
Albumin: 2.9 g/dL — ABNORMAL LOW (ref 3.5–5.2)
BUN: 11 mg/dL (ref 6–23)
CO2: 23 mEq/L (ref 19–32)
Calcium: 8.1 mg/dL — ABNORMAL LOW (ref 8.4–10.5)
Chloride: 103 mEq/L (ref 96–112)
Creatinine, Ser: 0.63 mg/dL (ref 0.50–1.35)
GFR calc non Af Amer: >90 mL/min (ref >90)
GLUCOSE: 91 mg/dL (ref 70–99)
POTASSIUM: 3.8 meq/L (ref 3.7–5.3)
SODIUM: 141 meq/L (ref 137–147)
TOTAL PROTEIN: 6.2 g/dL (ref 6.0–8.3)
Total Bilirubin: 0.4 mg/dL (ref 0.3–1.2)

## 2013-11-29 LAB — GLUCOSE, CAPILLARY
GLUCOSE-CAPILLARY: 105 mg/dL — AB (ref 70–99)
Glucose-Capillary: 101 mg/dL — ABNORMAL HIGH (ref 70–99)

## 2013-11-29 LAB — TROPONIN I
TROPONIN I: 1.8 ng/mL — AB (ref <0.30)
Troponin I: 2.36 ng/mL (ref <0.30)

## 2013-11-29 LAB — LACTIC ACID, PLASMA: Lactic Acid, Venous: 0.9 mmol/L (ref 0.5–2.2)

## 2013-11-29 LAB — PRO B NATRIURETIC PEPTIDE: PRO B NATRI PEPTIDE: 4393 pg/mL — AB (ref 0–125)

## 2013-11-29 MED ORDER — SODIUM CHLORIDE 0.9 % IV SOLN: INTRAVENOUS | Status: DC | PRN

## 2013-11-29 MED ORDER — INFLUENZA VAC SPLIT QUAD 0.5 ML IM SUSY
0.5000 mL | PREFILLED_SYRINGE | INTRAMUSCULAR | Status: AC
  Administered 2013-11-30: 0.5 mL via INTRAMUSCULAR
  Filled 2013-11-29: qty 0.5

## 2013-11-29 MED ORDER — ATORVASTATIN CALCIUM 40 MG PO TABS
40.0000 mg | ORAL_TABLET | Freq: Every day | ORAL | Status: DC
  Administered 2013-11-29 – 2013-11-30 (×2): 40 mg via ORAL
  Filled 2013-11-29 (×3): qty 1

## 2013-11-29 MED ORDER — FUROSEMIDE 10 MG/ML IJ SOLN
20.0000 mg | Freq: Once | INTRAMUSCULAR | Status: AC
  Administered 2013-11-29: 20 mg via INTRAVENOUS

## 2013-11-29 MED ORDER — ENOXAPARIN SODIUM 60 MG/0.6ML ~~LOC~~ SOLN
50.0000 mg | SUBCUTANEOUS | Status: DC
  Administered 2013-11-30 – 2013-12-01 (×2): 50 mg via SUBCUTANEOUS
  Filled 2013-11-29 (×2): qty 0.6

## 2013-11-29 MED ORDER — FENTANYL CITRATE 0.05 MG/ML IJ SOLN
25.0000 ug | INTRAMUSCULAR | Status: DC | PRN
  Administered 2013-11-29: 25 ug via INTRAVENOUS
  Filled 2013-11-29: qty 2

## 2013-11-29 MED ORDER — DOPAMINE-DEXTROSE 3.2-5 MG/ML-% IV SOLN
0.0000 ug/kg/min | INTRAVENOUS | Status: DC
  Administered 2013-11-29: 0 ug/kg/min via INTRAVENOUS
  Filled 2013-11-29: qty 250

## 2013-11-29 MED ORDER — GABAPENTIN 400 MG PO CAPS
800.0000 mg | ORAL_CAPSULE | Freq: Four times a day (QID) | ORAL | Status: DC
  Administered 2013-11-29 – 2013-12-01 (×7): 800 mg via ORAL
  Filled 2013-11-29 (×11): qty 2

## 2013-11-29 MED ORDER — CETYLPYRIDINIUM CHLORIDE 0.05 % MT LIQD: 7.0000 mL | Freq: Two times a day (BID) | OROMUCOSAL | Status: DC

## 2013-11-29 MED ORDER — OXYCODONE-ACETAMINOPHEN 5-325 MG PO TABS
1.0000 | ORAL_TABLET | ORAL | Status: DC | PRN
  Administered 2013-11-29 – 2013-12-01 (×10): 2 via ORAL
  Filled 2013-11-29 (×10): qty 2

## 2013-11-29 MED ORDER — GABAPENTIN 800 MG PO TABS: 800.0000 mg | ORAL_TABLET | Freq: Four times a day (QID) | ORAL | Status: DC

## 2013-11-29 MED ORDER — IPRATROPIUM-ALBUTEROL 0.5-2.5 (3) MG/3ML IN SOLN: 3.0000 mL | RESPIRATORY_TRACT | Status: DC | PRN

## 2013-11-29 MED ORDER — FUROSEMIDE 10 MG/ML IJ SOLN
INTRAMUSCULAR | Status: AC
  Filled 2013-11-29: qty 2

## 2013-11-29 MED ORDER — ATROPINE SULFATE 0.1 MG/ML IJ SOLN
INTRAMUSCULAR | Status: AC
  Filled 2013-11-29: qty 10

## 2013-11-29 MED FILL — Heparin Sodium (Porcine) 100 Unt/ML in Sodium Chloride 0.45%: INTRAMUSCULAR | Qty: 250 | Status: AC

## 2013-11-29 NOTE — Procedures (Signed)
Extubation Procedure Note  Patient Details:   Name: Walter [name] DOB: 06/01/1955 MRN: [id number]   Airway Documentation:  Airway 8 mm (Active)  Secured at (cm) 24 cm 11/29/2013  3:20 AM  Measured From Lips 11/29/2013  3:20 AM  Secured Location Left 11/29/2013  3:20 AM  Secured By [employer] Tube Holder 11/29/2013  3:20 AM  Tube Holder Repositioned Yes 11/29/2013  3:20 AM  Cuff Pressure (cm H2O) 30 cm H2O 11/29/2013  3:20 AM  Site Condition Dry 11/29/2013  3:20 AM    Evaluation  O2 sats: stable throughout Complications: No apparent complications Patient did tolerate procedure well. Bilateral Breath Sounds: Clear, Diminished Suctioning: Airway Yes, pt able to vocalize name and cough up secretions. Pt extubated to 4LPM [place] and O2 sats stable at 97% pt had a VC of 1.8 and is tolerating extubation well. RT will continue to monitor.   Tacy Learnurriff, [name] 11/29/2013, 6:18 AM

## 2013-11-29 NOTE — Progress Notes (Signed)
PULMONARY / CRITICAL CARE MEDICINE   Name: Walter [name] MRN: [id number] DOB: 12/29/1955    ADMISSION DATE:  11/28/2013 CONSULTATION DATE:  11/29/2013  REFERRING MD :  Gery PrayBarry  CHIEF COMPLAINT:  VDRF  INITIAL PRESENTATION:  58 yo male smoker [facility] with chest pain requiring cardiac cath.  He then developed respiratory distress, and then VT.  [facility].  PCCM consulted to assist with vent/medical management in ICU.  STUDIES:  11/2 cardiac cath >>> chronic occlusion of RCA with left-right collaterals.  95% stenosis with plaque rupture mid circumflex thought to be culprit lesion.  DES placed in circumflex.  SIGNIFICANT EVENTS: 11/1 [facility] 11/2 Cardiac cath, respiratory failure, non sustained VT, [facility]  SUBJECTIVE:  Off pressors.  Tolerating pressure support.  Denies chest/abd pain.  C/o back soreness.  VITAL SIGNS: Temp:  [97 F (36.1 C)-98.4 F (36.9 C)] 98.4 F (36.9 C) (11/03 0400) Pulse Rate:  [68-119] 75 (11/03 0500) Resp:  [10-27] 22 (11/03 0500) BP: (77-121)/(49-86) 105/74 mmHg (11/03 0500) SpO2:  [95 %-100 %] 95 % (11/03 0500) Arterial Line BP: (85-128)/(51-81) 127/74 mmHg (11/03 0500) FiO2 (%):  [40 %-50 %] 40 % (11/03 0400) Weight:  [225 lb 1.4 oz (102.1 kg)] 225 lb 1.4 oz (102.1 kg) (11/02 2000) VENTILATOR SETTINGS: Vent Mode:  [-] PRVC FiO2 (%):  [40 %-50 %] 40 % Set Rate:  [22 bmp] 22 bmp Vt Set:  [580 mL] 580 mL PEEP:  [5 cmH20] 5 cmH20 Plateau Pressure:  [16 cmH20] 16 cmH20 INTAKE / OUTPUT: Intake/Output      11/02 0701 - 11/03 0700   I.V. (mL/kg) 966 (9.5)   Total Intake(mL/kg) 966 (9.5)   Urine (mL/kg/hr) 925   Total Output 925   Net +41         PHYSICAL EXAMINATION: General: no distress Neuro: RASS 0, normal strength, follows commands HEENT: ETT in place Cardiovascular: regular, no murmur  Lungs: no wheeze Abdomen: soft, non tender Musculoskeletal: no edema Skin: no  rashes  LABS:  CBC  Recent Labs Lab 11/28/13 2115 11/29/13 0231  WBC 21.9* 9.2  HGB 13.1 12.3*  HCT 38.0* 35.7*  PLT 301 201   BMET  Recent Labs Lab 11/28/13 2115 11/29/13 0231  NA 139 141  K 3.9 3.8  CL 100 103  CO2 22 23  BUN 10 11  CREATININE 0.67 0.63  GLUCOSE 158* 91   Electrolytes  Recent Labs Lab 11/28/13 2115 11/29/13 0231  CALCIUM 8.5 8.1*   Sepsis Markers  Recent Labs Lab 11/28/13 2115 11/29/13 0231  LATICACIDVEN 1.3 0.9   ABG  Recent Labs Lab 11/28/13 1919 11/28/13 2128 11/28/13 2230  PHART 7.315* 7.445 7.481*  PCO2ART 50.5* 34.8* 31.2*  PO2ART 241.0* 105.0* 158.0*   Liver Enzymes  Recent Labs Lab 11/29/13 0231  AST 16  ALT 9  ALKPHOS 91  BILITOT 0.4  ALBUMIN 2.9*   Cardiac Enzymes  Recent Labs Lab 11/28/13 2115 11/29/13 0231  TROPONINI 3.09* 2.36*  PROBNP  --  4393.0*   Glucose  Recent Labs Lab 11/29/13 0041 11/29/13 0450  GLUCAP 101* 105*    Imaging Dg Abd Portable 1v  11/28/2013   CLINICAL DATA:  NG tube placement.  EXAM: PORTABLE ABDOMEN - 1 VIEW  COMPARISON:  None.  FINDINGS: The NG tube tip is in the body region of the stomach. The bowel gas pattern is unremarkable. Contrast is noted in both kidneys. The lung bases  are grossly clear.  IMPRESSION: The NG tube is in the stomach.   Electronically Signed   By: Loralie ChampagneMark  Gallerani M.D.   On: 11/28/2013 22:20     ASSESSMENT / PLAN:  PULMONARY ETT 11/2 >>> A: Acute hypoxic respiratory failure 2nd to acute pulmonary edema. Hx of tobacco abuse and reported hx of COPD. P:   Proceed with extubation 11/03 F/u CXR intermittently Continue budesonide/brovana with prn duoneb Hold outpatient spiriva, advair  CARDIOVASCULAR R femoral sheath 11/2 >>> A:  Hypotension 11/02 >> likely from diuresis, and meds >> off pressors 11/03. CAD s/p DES to circumflex 11/02. Acute on chronic diastolic CHF with hx of HTN. P:  Per cardiology  RENAL A:   No acute issues. P:    Monitor renal fx, urine outpt, electrolytes  GASTROINTESTINAL A:   Nutrition. P:   Protonix for SUP Advance diet as tolerated after extubation  HEMATOLOGIC A:   Leukocytosis likely 2nd to acute stress in setting of ACS >> improved 11/03. P:  SCD's / Lovenox F/u CBC  INFECTIOUS A:   No evidence for infection. P:   Monitor clinically  ENDOCRINE A:   No acute issues. P:   Monitor blood sugar on BMET  NEUROLOGIC A:   Acute encephalopathy 2nd to respiratory failure >> improved 11/03. Back pain.  P:   Prn tylenol, percocet, fentanyl for mild/mod/severe pain   Family updated:   Interdisciplinary Family Meeting v Palliative Care Meeting:  Due by: 11/9   TODAY'S SUMMARY: CXR, oxygenation, hemodynamics, respiratory mechanics improved >> will proceed with extubation.  CC time 40 minutes.  Coralyn HellingVineet Callan Yontz, MD [facility] Pulmonary/Critical [facility] 11/29/2013, 6:03 AM Pager:  [phone] After 3pm call: [phone]

## 2013-11-29 NOTE — Plan of Care (Signed)
Problem: Consults Goal: Nutrition Consult-if indicated Outcome: Not Applicable Date Met:  52/08/02  Problem: Phase I Progression Outcomes Goal: Other Phase I Outcomes/Goals Outcome: Not Applicable Date Met:  23/36/12  Problem: Phase II Progression Outcomes Goal: Vascular site scale level 0 - I Vascular Site Scale Level 0: No bruising/bleeding/hematoma Level I (Mild): Bruising/Ecchymosis, minimal bleeding/ooozing, palpable hematoma < 3 cm Level II (Moderate): Bleeding not affecting hemodynamic parameters, pseudoaneurysm, palpable hematoma > 3 cm Level III (Severe) Bleeding which affects hemodynamic parameters or retroperitoneal hemorrhage  Outcome: Completed/Met Date Met:  11/29/13 Goal: Tolerating diet Outcome: Completed/Met Date Met:  11/29/13

## 2013-11-29 NOTE — Progress Notes (Signed)
CRITICAL VALUE ALERT  Critical value received:  Troponin  Date of notification:  11/28/13  Time of notification: 22:10  Critical value read back:Yes.    Nurse who received alert:  [place], RN  MD notified (1st page):  Joneen RoachPaul Hoffman  Time of first page:  22:15  MD notified (2nd page):  Time of second page:  Responding MD:  Joneen RoachPaul Hoffman  Time MD responded:  22:15  No new orders given. Continue to cycle troponin labs and watch for increase.

## 2013-11-29 NOTE — Progress Notes (Signed)
Utilization review complete. Izea Livolsi RN CCM Case Mgmt phone [phone] 

## 2013-11-29 NOTE — Progress Notes (Signed)
SUBJECTIVE:  No chest pain.  Feels like he has a cold sweat.  Breathing easily.  OBJECTIVE:   Vitals:   Filed Vitals:   11/29/13 0615 11/29/13 0700 11/29/13 0730 11/29/13 0800  BP: 138/79 127/76 126/79 146/79  Pulse: 89 87 79 95  Temp:   98.3 F (36.8 C)   TempSrc:   Oral   Resp: 20 21 28 27   Height:      Weight:      SpO2: 95% 94%  98%   I&O's:   Intake/Output Summary (Last 24 hours) at 11/29/13 1005 Last data filed at 11/29/13 0900  Gross per 24 hour  Intake 1530.15 ml  Output   1170 ml  Net 360.15 ml   TELEMETRY: Reviewed telemetry pt in NSR:     PHYSICAL EXAM General: Well developed, well nourished, in no acute distress Head:   Normal cephalic and atramatic  Lungs:   Basilar crackles Heart:   HRRR S1 S2  No JVD.   Abdomen: abdomen soft and non-tender Msk:  Back normal,  Normal strength and tone for age. Extremities:   No edema.  2+ right PT pulse; no right groin hematoma; no right wrist hematoma Neuro: Alert and oriented. Psych:  Normal affect, responds appropriately   LABS: Basic Metabolic Panel:  Recent Labs  16/10/9609/02/15 2115 11/29/13 0231  NA 139 141  K 3.9 3.8  CL 100 103  CO2 22 23  GLUCOSE 158* 91  BUN 10 11  CREATININE 0.67 0.63  CALCIUM 8.5 8.1*   Liver Function Tests:  Recent Labs  11/29/13 0231  AST 16  ALT 9  ALKPHOS 91  BILITOT 0.4  PROT 6.2  ALBUMIN 2.9*   No results for input(s): LIPASE, AMYLASE in the last 72 hours. CBC:  Recent Labs  11/28/13 2115 11/29/13 0231  WBC 21.9* 9.2  HGB 13.1 12.3*  HCT 38.0* 35.7*  MCV 83.7 83.6  PLT 301 201   Cardiac Enzymes:  Recent Labs  11/28/13 2115 11/29/13 0231 11/29/13 0825  TROPONINI 3.09* 2.36* 1.80*   BNP: Invalid input(s): POCBNP D-Dimer: No results for input(s): DDIMER in the last 72 hours. Hemoglobin A1C: No results for input(s): HGBA1C in the last 72 hours. Fasting Lipid Panel:  Recent Labs  11/29/13 0231  CHOL 127  HDL 32*  LDLCALC 63  TRIG 045161*    CHOLHDL 4.0   Thyroid Function Tests: No results for input(s): TSH, T4TOTAL, T3FREE, THYROIDAB in the last 72 hours.  Invalid input(s): FREET3 Anemia Panel: No results for input(s): VITAMINB12, FOLATE, FERRITIN, TIBC, IRON, RETICCTPCT in the last 72 hours. Coag Panel:   No results found for: INR, PROTIME  RADIOLOGY: Portable Chest Xray  11/29/2013   CLINICAL DATA:  Respiratory failure ; assess support tube position  EXAM: PORTABLE CHEST - 1 VIEW  COMPARISON:  Portable abdominal film of November 28, 2013  FINDINGS: The lungs are well-expanded. The interstitial markings are mildly increased bilaterally. The cardiac silhouette is top-normal in size. The pulmonary vascularity is not engorged. There is no significant pleural effusion. The observed bony thorax is unremarkable.  IMPRESSION:  Mildly increased interstitial markings bilaterally are consistent with interstitial edema. There is no alveolar pneumonia. The support tubes are in reasonable position.   Electronically Signed   By: David  SwazilandJordan   On: 11/29/2013 07:45   Dg Abd Portable 1v  11/28/2013   CLINICAL DATA:  NG tube placement.  EXAM: PORTABLE ABDOMEN - 1 VIEW  COMPARISON:  None.  FINDINGS: The  NG tube tip is in the body region of the stomach. The bowel gas pattern is unremarkable. Contrast is noted in both kidneys. The lung bases are grossly clear.  IMPRESSION: The NG tube is in the stomach.   Electronically Signed   By: Loralie ChampagneMark  Gallerani M.D.   On: 11/28/2013 22:20      ASSESSMENT: Roosvelt Harps/PLAN:    1) CAD/NSTEMI: s/p PCI of the circumflex.  Repeat cath showed patent stent with CTO of RCA.  Unclear as to why he had the VT and respiratory failure requiring intubation.  Obtain echo result from [place] to make sure there was no significant MR in teh setting of RCA and circ disease.  OK to move to stepdown from a cardiac standpoint.  Cath sites are intact.   Trop decreasing.  2) Recommended tobacco cessation.  3) Chronic diastolic heart  failure: Avoid excessive fluids.  Hypotension resolved.  Evidence of pulmonary edema by CXR. Trying to find echo result.  Will give one dose of IV lasix to help with interstitial edema noted on CXR.  Hyperlipidemia: LDL 63. Resume atorvastatin.  OK to move to stepdown from a caridac standpoint.  Corky CraftsVARANASI,Virgil Lightner S., MD  11/29/2013  10:05 AM

## 2013-11-29 NOTE — Progress Notes (Signed)
CARE MANAGEMENT NOTE 11/29/2013  Patient:  Saunders RevelNDERSON,Ly E   Account Number:  [id number]  Date Initiated:  11/29/2013  Documentation initiated by:  [facility]AVIS,Katlynn Naser  Subjective/Objective Assessment:   Acute respiratory failure, STEMI     Action/Plan:   Anticipated DC Date:  11/30/2013   Anticipated DC Plan:  HOME/SELF CARE      DC Planning Services  CM consult  Medication Assistance      Choice offered to / List presented to:             Status of service:  Completed, signed off Medicare Important Message given?   (If response is "NO", the following Medicare IM given date fields will be blank) Date Medicare IM given:   Medicare IM given by:   Date Additional Medicare IM given:   Additional Medicare IM given by:    Discharge Disposition:  HOME/SELF CARE  Per UR Regulation:  Reviewed for med. necessity/level of care/duration of stay  If discussed at Long Length of Stay Meetings, dates discussed:    Comments:  11/29/2013 1130 NCM spoke to pt and provided Brilinta 30 day free trial card with info brochure. Review side effects of medications and what to report to report to his physician. Pt lives at home with brother, Tillman SersRobert Villacres and sister, Macario CarlsGloria Wood. They will assist him with his care. Last follow up appt with PCP, Dr Ellsworth LennoxSie Tejan was on 11/11/2013. States he sees the PCP once per month. Isidoro DonningAlesia Brodyn Depuy RN CCM Case Mgmt phone [phone]

## 2013-11-30 ENCOUNTER — Encounter (HOSPITAL_COMMUNITY): Payer: Self-pay | Admitting: *Deleted

## 2013-11-30 DIAGNOSIS — I25118 Atherosclerotic heart disease of native coronary artery with other forms of angina pectoris: Secondary | ICD-10-CM

## 2013-11-30 DIAGNOSIS — Z4659 Encounter for fitting and adjustment of other gastrointestinal appliance and device: Secondary | ICD-10-CM | POA: Insufficient documentation

## 2013-11-30 LAB — CBC
HEMATOCRIT: 38 % — AB (ref 39.0–52.0)
HEMOGLOBIN: 12.9 g/dL — AB (ref 13.0–17.0)
MCH: 28.7 pg (ref 26.0–34.0)
MCHC: 33.9 g/dL (ref 30.0–36.0)
MCV: 84.6 fL (ref 78.0–100.0)
Platelets: 187 10*3/uL (ref 150–400)
RBC: 4.49 MIL/uL (ref 4.22–5.81)
RDW: 15.7 % — AB (ref 11.5–15.5)
WBC: 8.4 10*3/uL (ref 4.0–10.5)

## 2013-11-30 LAB — BASIC METABOLIC PANEL
Anion gap: 17 — ABNORMAL HIGH (ref 5–15)
BUN: 15 mg/dL (ref 6–23)
CALCIUM: 8.5 mg/dL (ref 8.4–10.5)
CO2: 24 meq/L (ref 19–32)
Chloride: 103 mEq/L (ref 96–112)
Creatinine, Ser: 0.68 mg/dL (ref 0.50–1.35)
GFR calc Af Amer: >90 mL/min (ref >90)
GLUCOSE: 85 mg/dL (ref 70–99)
Potassium: 3.1 mEq/L — ABNORMAL LOW (ref 3.7–5.3)
Sodium: 144 mEq/L (ref 137–147)

## 2013-11-30 LAB — MAGNESIUM: Magnesium: 2 mg/dL (ref 1.5–2.5)

## 2013-11-30 MED ORDER — TIOTROPIUM BROMIDE MONOHYDRATE 18 MCG IN CAPS: 18.0000 ug | ORAL_CAPSULE | Freq: Every day | RESPIRATORY_TRACT | Status: DC

## 2013-11-30 MED ORDER — ALBUTEROL SULFATE HFA 108 (90 BASE) MCG/ACT IN AERS: 2.0000 | INHALATION_SPRAY | Freq: Four times a day (QID) | RESPIRATORY_TRACT | Status: DC | PRN

## 2013-11-30 MED ORDER — BUDESONIDE-FORMOTEROL FUMARATE 160-4.5 MCG/ACT IN AERO
2.0000 | INHALATION_SPRAY | Freq: Two times a day (BID) | RESPIRATORY_TRACT | Status: DC
  Filled 2013-11-30: qty 6

## 2013-11-30 MED ORDER — ASPIRIN EC 81 MG PO TBEC: 81.0000 mg | DELAYED_RELEASE_TABLET | Freq: Every day | ORAL | Status: DC

## 2013-11-30 MED ORDER — ALPRAZOLAM 0.5 MG PO TABS
1.0000 mg | ORAL_TABLET | Freq: Two times a day (BID) | ORAL | Status: DC
  Administered 2013-11-30 – 2013-12-01 (×3): 1 mg via ORAL
  Filled 2013-11-30 (×3): qty 2

## 2013-11-30 MED ORDER — IPRATROPIUM-ALBUTEROL 20-100 MCG/ACT IN AERS: 2.0000 | INHALATION_SPRAY | Freq: Two times a day (BID) | RESPIRATORY_TRACT | Status: DC

## 2013-11-30 MED ORDER — ATORVASTATIN CALCIUM 40 MG PO TABS: 40.0000 mg | ORAL_TABLET | Freq: Every evening | ORAL | Status: DC

## 2013-11-30 MED ORDER — CETYLPYRIDINIUM CHLORIDE 0.05 % MT LIQD
7.0000 mL | Freq: Two times a day (BID) | OROMUCOSAL | Status: DC
  Administered 2013-11-30: 7 mL via OROMUCOSAL

## 2013-11-30 MED ORDER — MOMETASONE FURO-FORMOTEROL FUM 100-5 MCG/ACT IN AERO: 2.0000 | INHALATION_SPRAY | Freq: Two times a day (BID) | RESPIRATORY_TRACT | Status: DC

## 2013-11-30 MED ORDER — OXYCODONE-ACETAMINOPHEN 10-325 MG PO TABS
1.0000 | ORAL_TABLET | ORAL | Status: DC | PRN
Start: 2013-11-30 — End: 2013-11-30

## 2013-11-30 MED ORDER — FUROSEMIDE 40 MG PO TABS
40.0000 mg | ORAL_TABLET | Freq: Once | ORAL | Status: AC
  Administered 2013-11-30: 40 mg via ORAL
  Filled 2013-11-30: qty 1

## 2013-11-30 MED ORDER — ENALAPRIL MALEATE 5 MG PO TABS
5.0000 mg | ORAL_TABLET | Freq: Every day | ORAL | Status: DC
Start: 2013-11-30 — End: 2013-12-01
  Administered 2013-11-30 – 2013-12-01 (×2): 5 mg via ORAL
  Filled 2013-11-30 (×2): qty 1

## 2013-11-30 MED ORDER — POTASSIUM CHLORIDE CRYS ER 20 MEQ PO TBCR
40.0000 meq | EXTENDED_RELEASE_TABLET | Freq: Two times a day (BID) | ORAL | Status: AC
  Administered 2013-11-30 (×2): 40 meq via ORAL
  Filled 2013-11-30 (×2): qty 2

## 2013-11-30 MED ORDER — CARVEDILOL 12.5 MG PO TABS
12.5000 mg | ORAL_TABLET | Freq: Two times a day (BID) | ORAL | Status: DC
  Administered 2013-11-30 – 2013-12-01 (×3): 12.5 mg via ORAL
  Filled 2013-11-30 (×5): qty 1

## 2013-11-30 NOTE — Progress Notes (Signed)
SUBJECTIVE:  No chest pain.  Feels much better.  OBJECTIVE:   Vitals:   Filed Vitals:   11/30/13 0400 11/30/13 0500 11/30/13 0600 11/30/13 0747  BP: 116/73 134/54 149/93 167/91  Pulse: 75 78 72 65  Temp: 97.7 F (36.5 C)   98 F (36.7 C)  TempSrc: Oral   Oral  Resp: 21 14 16 14   Height:      Weight:  219 lb 5.7 oz (99.5 kg)    SpO2: 92% 93% 94% 96%   I&O's:    Intake/Output Summary (Last 24 hours) at 11/30/13 0855 Last data filed at 11/30/13 0500  Gross per 24 hour  Intake    368 ml  Output   2375 ml  Net  -2007 ml   TELEMETRY: Reviewed telemetry pt in NSR:     PHYSICAL EXAM General: Well developed, well nourished, in no acute distress Head:   Normal cephalic and atramatic  Lungs:   Clear to auscultation Heart:   HRRR S1 S2  No JVD.   Abdomen: abdomen soft and non-tender Msk:  Back normal,  Normal strength and tone for age. Extremities:   No edema.   Neuro: Alert and oriented. Psych:  Normal affect, responds appropriately Skin: no rash   LABS: Basic Metabolic Panel:  Recent Labs  16/10/9609/03/15 0231 11/30/13 0241  NA 141 144  K 3.8 3.1*  CL 103 103  CO2 23 24  GLUCOSE 91 85  BUN 11 15  CREATININE 0.63 0.68  CALCIUM 8.1* 8.5  MG  --  2.0   Liver Function Tests:  Recent Labs  11/29/13 0231  AST 16  ALT 9  ALKPHOS 91  BILITOT 0.4  PROT 6.2  ALBUMIN 2.9*   No results for input(s): LIPASE, AMYLASE in the last 72 hours. CBC:  Recent Labs  11/29/13 0231 11/30/13 0241  WBC 9.2 8.4  HGB 12.3* 12.9*  HCT 35.7* 38.0*  MCV 83.6 84.6  PLT 201 187   Cardiac Enzymes:  Recent Labs  11/28/13 2115 11/29/13 0231 11/29/13 0825  TROPONINI 3.09* 2.36* 1.80*   BNP: Invalid input(s): POCBNP D-Dimer: No results for input(s): DDIMER in the last 72 hours. Hemoglobin A1C: No results for input(s): HGBA1C in the last 72 hours. Fasting Lipid Panel:  Recent Labs  11/29/13 0231  CHOL 127  HDL 32*  LDLCALC 63  TRIG 045161*  CHOLHDL 4.0    Thyroid Function Tests: No results for input(s): TSH, T4TOTAL, T3FREE, THYROIDAB in the last 72 hours.  Invalid input(s): FREET3 Anemia Panel: No results for input(s): VITAMINB12, FOLATE, FERRITIN, TIBC, IRON, RETICCTPCT in the last 72 hours. Coag Panel:   No results found for: INR, PROTIME  RADIOLOGY: Portable Chest Xray  11/29/2013   CLINICAL DATA:  Respiratory failure ; assess support tube position  EXAM: PORTABLE CHEST - 1 VIEW  COMPARISON:  Portable abdominal film of November 28, 2013  FINDINGS: The lungs are well-expanded. The interstitial markings are mildly increased bilaterally. The cardiac silhouette is top-normal in size. The pulmonary vascularity is not engorged. There is no significant pleural effusion. The observed bony thorax is unremarkable.  IMPRESSION:  Mildly increased interstitial markings bilaterally are consistent with interstitial edema. There is no alveolar pneumonia. The support tubes are in reasonable position.   Electronically Signed   By: David  [place]Jordan   On: 11/29/2013 07:45   Dg Abd Portable 1v  11/28/2013   CLINICAL DATA:  NG tube placement.  EXAM: PORTABLE ABDOMEN - 1 VIEW  COMPARISON:  None.  FINDINGS: The NG tube tip is in the body region of the stomach. The bowel gas pattern is unremarkable. Contrast is noted in both kidneys. The lung bases are grossly clear.  IMPRESSION: The NG tube is in the stomach.   Electronically Signed   By: Loralie ChampagneMark  Gallerani M.D.   On: 11/28/2013 22:20      ASSESSMENT: Roosvelt Harps/PLAN:    1) CAD/NSTEMI: s/p PCI of the circumflex.  Repeat cath showed patent stent with CTO of RCA.  Unclear as to why he had the VT and respiratory failure requiring intubation.  Obtain echo result from [place] to make sure there was no significant MR in teh setting of RCA and circ disease.  OK to move to telemetry from a cardiac standpoint.  Flash pulmonary edema post cath  Trop decreasing.  Will give one dose of oral Lasix  2) Recommended tobacco cessation.  3)  Chronic diastolic heart failure: Avoid excessive fluids.  Hypotension resolved.  Evidence of pulmonary edema by CXR. Trying to find echo result.    Hyperlipidemia: LDL 63. Resume atorvastatin.  OK to move to tele.  Anticipate d/c tomorrow.  Walter CraftsVARANASI,Calvin Jablonowski S., MD  11/30/2013  8:55 AM

## 2013-11-30 NOTE — Progress Notes (Signed)
Nutrition Brief Note  Patient identified on the Malnutrition Screening Tool (MST) Report  Pt with hx of weight loss back which started last June due to pt developing c.diff during hospitalization after appy.  Pt states that his weight has been stable since Sept and he has even gained a few pounds.  Pt reports normal appetite. However, pt only eats one meal per day which is normal for pt. He sometimes snacks during the day but mostly drinks Pepsi and smokes during the day. He eats a normal supper meal in the evening. He has a back ground in nutrition and does most of the cooking and shopping at home. Pt reports he does not eat fried foods, recently cooking mostly grilled or baked fish/chicken with rice and a vegetable. Pt reports this has been a wake up call for him. Pt feels he is in someway's reducing his sodium intake. Pt likes to cook and uses garlic powder. Intern and RD did review some sodium guidelines, cooking suggestions, rinsing canned vegetables, and label reading. Pt planning for d/c 11/5 and plans to attend cardiac rehab after discharge. Encouraged pt to follow up with RD at cardiac rehab.  Pt reports good appetite at this time.   Wt Readings from Last 15 Encounters:  11/30/13 219 lb 5.7 oz (99.5 kg)  05/27/13 256 lb (116.121 kg)  02/24/13 242 lb 8 oz (109.997 kg)  11/12/12 243 lb 8 oz (110.451 kg)  09/17/12 237 lb 1.9 oz (107.557 kg)  08/02/12 232 lb 8 oz (105.461 kg)  05/17/12 233 lb 4 oz (105.802 kg)  02/27/12 220 lb 8 oz (100.018 kg)  02/04/12 226 lb 4 oz (102.626 kg)    Body mass index is 32.38 kg/(m^2). Patient meets criteria for Obesity Class I based on current BMI.   Current diet order is Heart Healthy, patient is consuming approximately 50% of meals at this time. Labs and medications reviewed.   No nutrition interventions warranted at this time. If nutrition issues arise, please consult RD.   Kendell BaneHeather Wofford Stratton RD, LDN, CNSC [phone] Pager [phone] After Hours  Pager

## 2013-11-30 NOTE — Progress Notes (Signed)
CARDIAC REHAB PHASE I   PRE:  Rate/Rhythm: 74 SR  BP:  Supine:   Sitting: 126/75  Standing:    SaO2: 97%RA  MODE:  Ambulation: 550 ft   POST:  Rate/Rhythm: 86 SR  BP:  Supine:   Sitting: 106/66  Standing:    SaO2: 99%RA 1345-1500 Pt walked 550 ft with steady gait. No CP. Slightly SOB but tolerated well. MI and CHF ed completed. Reviewed CHF zones and when to call MD. Encouraged daily weights and low sodium diet. Discussed importance of brilinta . Discussed smoking cessation and pt to quit cold [place]. He quit for over a year once doing this. Discussed CRP2 and pt gave [facility]. Pt voiced understanding of all ed.   Luetta Nuttingharlene Tinslee Klare, RN BSN  11/30/2013 2:54 PM

## 2013-11-30 NOTE — Progress Notes (Signed)
Wasted 150 ml of Fentanyl in sink.  Witnessed by Suzzette RighterErin Smith, RN

## 2013-11-30 NOTE — Progress Notes (Signed)
PULMONARY / CRITICAL CARE MEDICINE   Name: Walter [name] MRN: [id number] DOB: 05/31/1955    ADMISSION DATE:  11/28/2013 CONSULTATION DATE:  11/30/2013  REFERRING MD :  Gery PrayBarry  CHIEF COMPLAINT:  VDRF  INITIAL PRESENTATION:  58 yo [facility] smoker [facility] requiring cardiac cath.  He then developed respiratory distress, and then VT.  [facility]  PCCM consulted to assist with vent/medical management in ICU.  STUDIES:  11/2 cardiac cath >>> chronic occlusion of RCA with left-right collaterals.  95% stenosis with plaque rupture mid circumflex thought to be culprit lesion.  DES placed in circumflex.  SIGNIFICANT EVENTS: 11/1 [facility] 11/2 Cardiac cath, respiratory failure, non sustained VT, [facility] 11/3 extubated  SUBJECTIVE:  Off vent,no distress  VITAL SIGNS: Temp:  [97.5 F (36.4 C)-98.5 F (36.9 C)] 98 F (36.7 C) (11/04 0747) Pulse Rate:  [63-103] 65 (11/04 0747) Resp:  [14-29] 14 (11/04 0747) BP: (101-176)/(46-97) 167/91 mmHg (11/04 0747) SpO2:  [92 %-99 %] 99 % (11/04 1015) Weight:  [99.5 kg (219 lb 5.7 oz)] 99.5 kg (219 lb 5.7 oz) (11/04 0500) VENTILATOR SETTINGS:   INTAKE / OUTPUT: Intake/Output      11/03 0701 - 11/04 0700 11/04 0701 - 11/05 0700   P.O. 290    I.V. (mL/kg) 159.3 (1.6) 3 (0)   Total Intake(mL/kg) 449.3 (4.5) 3 (0)   Urine (mL/kg/hr) 2500 (1)    Total Output 2500     Net -2050.8 +3          PHYSICAL EXAMINATION: General: no distress Neuro: nonfocal HEENT: jvd wnl Cardiovascular: s1 s2 rrr Lungs: CTA Abdomen: soft, non tender Musculoskeletal: no edema Skin: no rashes  LABS:  CBC  Recent Labs Lab 11/28/13 2115 11/29/13 0231 11/30/13 0241  WBC 21.9* 9.2 8.4  HGB 13.1 12.3* 12.9*  HCT 38.0* 35.7* 38.0*  PLT 301 201 187   BMET  Recent Labs Lab 11/28/13 2115 11/29/13 0231 11/30/13 0241  NA 139 141 144  K 3.9 3.8 3.1*  CL 100 103 103  CO2 22 23 24   BUN 10  11 15   CREATININE 0.67 0.63 0.68  GLUCOSE 158* 91 85   Electrolytes  Recent Labs Lab 11/28/13 2115 11/29/13 0231 11/30/13 0241  CALCIUM 8.5 8.1* 8.5  MG  --   --  2.0   Sepsis Markers  Recent Labs Lab 11/28/13 2115 11/29/13 0231  LATICACIDVEN 1.3 0.9   ABG  Recent Labs Lab 11/28/13 1919 11/28/13 2128 11/28/13 2230  PHART 7.315* 7.445 7.481*  PCO2ART 50.5* 34.8* 31.2*  PO2ART 241.0* 105.0* 158.0*   Liver Enzymes  Recent Labs Lab 11/29/13 0231  AST 16  ALT 9  ALKPHOS 91  BILITOT 0.4  ALBUMIN 2.9*   Cardiac Enzymes  Recent Labs Lab 11/28/13 2115 11/29/13 0231 11/29/13 0825  TROPONINI 3.09* 2.36* 1.80*  PROBNP  --  4393.0*  --    Glucose  Recent Labs Lab 11/29/13 0041 11/29/13 0450  GLUCAP 101* 105*    Imaging Portable Chest Xray  11/29/2013   CLINICAL DATA:  Respiratory failure ; assess support tube position  EXAM: PORTABLE CHEST - 1 VIEW  COMPARISON:  Portable abdominal film of November 28, 2013  FINDINGS: The lungs are well-expanded. The interstitial markings are mildly increased bilaterally. The cardiac silhouette is top-normal in size. The pulmonary vascularity is not engorged. There is no significant pleural effusion. The observed bony thorax is unremarkable.  IMPRESSION:  Mildly increased interstitial markings bilaterally are consistent with interstitial edema. There is no alveolar pneumonia. The support tubes are in reasonable position.   Electronically Signed   By: David  [place]   On: 11/29/2013 07:45     ASSESSMENT / PLAN:  PULMONARY ETT 11/2 >>>11/3 A: Acute hypoxic respiratory failure 2nd to acute pulmonary edema. Hx of tobacco abuse and reported hx of COPD. P:   IS as able Extubated dc budesonide/brovana with prn duoneb Hold outpatient spiriva, advair Mobilize Balance lasix per cards  CARDIOVASCULAR R femoral sheath 11/2 >>>11/3 A:  Hypotension 11/02 >> likely from diuresis, and meds >> off pressors 11/03. CAD s/p DES to  circumflex 11/02. Acute on chronic diastolic CHF with hx of HTN. P:  Per cardiology  RENAL A:   Hypo K  P:   replace K  Recheck lytes in am   GASTROINTESTINAL A:   Nutrition. P:   Protonix for SUP, extubated, dc if not home med Advance diet as tolerated after extubation  HEMATOLOGIC A:   Leukocytosis likely 2nd to acute stress in setting of ACS >> improved 11/03. P:  SCD's  / brilianta  NEUROLOGIC A:   Acute encephalopathy 2nd to respiratory failure >> improved 11/03. Back pain.  P:   Mobilize when ok by cards   Family updated: patient  Interdisciplinary Family Meeting v Palliative Care Meeting:  Due by: 11/9   TODAY'S SUMMARY: remains without distress, change to home regimen, wil sign off , call if needed  Mcarthur RossettiDaniel J. Tyson AliasFeinstein, MD, FACP Pgr: [phone] [facility]

## 2013-12-01 DIAGNOSIS — I25110 Atherosclerotic heart disease of native coronary artery with unstable angina pectoris: Secondary | ICD-10-CM

## 2013-12-01 DIAGNOSIS — I059 Rheumatic mitral valve disease, unspecified: Secondary | ICD-10-CM

## 2013-12-01 MED ORDER — FUROSEMIDE 20 MG PO TABS: 20.0000 mg | ORAL_TABLET | Freq: Every day | ORAL | Status: AC

## 2013-12-01 MED ORDER — TICAGRELOR 90 MG PO TABS: 90.0000 mg | ORAL_TABLET | Freq: Two times a day (BID) | ORAL | Status: DC

## 2013-12-01 MED ORDER — NITROGLYCERIN 0.4 MG SL SUBL: 0.4000 mg | SUBLINGUAL_TABLET | SUBLINGUAL | Status: AC | PRN

## 2013-12-01 NOTE — Discharge Summary (Signed)
Physician Discharge Summary  Patient ID: Walter [name] MRN: [id number] DOB/AGE: 57/01/1955 57 y.o.   Primary Cardiologist: Dr. Kirke CorinArida  Admit date: 11/28/2013 Discharge date: 12/01/2013  Admission Diagnoses: STEMI  Discharge Diagnoses:  Principal Problem:  Acute respiratory failure Active Problems:  CAD (coronary artery disease)  Hypertension  Chronic back pain  Acute combined systolic and diastolic CHF, NYHA class 4  Non-Q wave ST elevation myocardial infarction (STEMI) involving left circumflex coronary artery  Shock  Encounter for orogastric (OG) tube placement   Discharged Condition: stable  Hospital Course: The patient is a 58 y/o male with a history of CAD, hypertension and ongoing tobacco abuse who presented to [facility] on [facility]/27/13 with chest pain. Cardiac enzymes were cycled and he ruled in for NSTEMI. He was admitted to the CCU and placed on IV heparin and IV NTG. He was also placed on ASA, high dose statin and a BB. He underwent a LHC by Dr. Kirke CorinArida and found to have an occluded codominant RCA and high-grade codominant proximal mid circumflex disease. He underwent stenting of his circumflex with a 4 mm x 23 mm long Xience drug-eluting stent postdilated with a 4.5 mm balloon with an excellent angiographic result. Several hours post procedure, he became suddenly hypotensive, diaphoretic and short of breath. He had runs of ventricular tachycardia and ultimately required intubation and sedation. Because of concern about whether or not there was a technical problem with the stent, the patient was urgently transferred to [facility] for cardiac catheterization to redefine his anatomy. His blood pressure when he arrived was 40/20 and his nitroglycerin was discontinued. He was given a 250 mL bolus of IVFs and placed on a levophed drip. He underwent a repeat cath by Dr. Allyson SabalBerry. This demonstrated a widely patent circumflex stent and mid LAD disease with only 50% stenosis. No culprit  lesions were identified. It was unsure why he had VT and respiratory failure requiring intubation. A CXR demonstrated pulmonary edema. He improved with IV Lasix. He was successfully extubated. A 2D echo was obtained and demonstrated mildly reduced systolic function with an EF of 45-50% with mild inferior hypokinesis. Grade I diastolic dysfunction was also noted as well as mild MR. He was continued on his BB, ACE and low dose diuretic. He was also placed on DAPT with ASA + Brilinta. He had no further chest pain and no further hypotension. He was last seen and examined by Dr. Rennis GoldenHilty who determined he was stable for discharge home. Post-hospital f/u has been arranged with Dr. Kirke CorinArida in [place] on 12/12/13.     Consults: None  Significant Diagnostic Studies:     LHC 11/28/13 HEMODYNAMICS:   AO SYSTOLIC/AO DIASTOLIC:   LV SYSTOLIC/LV DIASTOLIC: 80/12  ANGIOGRAPHIC RESULTS:   1. Left main; normal  2. LAD; I most 50% mid 3. Left circumflex; codominant with a widely patent proximal and mid circumflex stent.  4. Left ventriculography;not performed   2D echo 12/01/13  Study Conclusions  - Left ventricle: The cavity size was normal. Wall thickness was increased in a pattern of moderate LVH. Systolic function was mildly reduced. The estimated ejection fraction was in the range of 45% to 50%. Mild inferior hypokinesis. Doppler parameters are consistent with abnormal left ventricular relaxation (grade 1 diastolic dysfunction). The E/e&' ratio is between 8-15, suggesting indeterminate LV filling pressure. - Ventricular septum: Incoordinate septal motion. - Mitral valve: Mildly thickened leaflets . There was mild regurgitation. - Left atrium: The atrium was normal in size.  Impressions:  -  LVEF 45-50%, moderate LVH, mild inferior hypokinesis, diastolic dysfunction, indeterminte LV Filling pressure, mild MR.   Treatments: See Hospital Course  Discharge  Exam: Blood pressure 94/60, pulse 62, temperature 97.8 F (36.6 C), temperature source Oral, resp. rate 20, height 5\' 9"  (1.753 m), weight 219 lb 5.7 oz (99.5 kg), SpO2 96 %.   Disposition: 01-Home or Self Care      Discharge Instructions    Amb Referral to Cardiac Rehabilitation  Complete by: As directed   Referring to [facility]            Medication List    TAKE these medications       acetaminophen 500 MG tablet  Commonly known as: TYLENOL  Take 1,000 mg by mouth every 6 (six) hours as needed. pain     ADVAIR DISKUS 250-50 MCG/DOSE Aepb  Generic drug: Fluticasone-Salmeterol  Inhale 1 puff into the lungs every 12 (twelve) hours.     ALPRAZolam 1 MG tablet  Commonly known as: XANAX  Take 1 mg by mouth 2 (two) times daily.     aspirin EC 81 MG tablet  Take 81 mg by mouth daily.     atorvastatin 40 MG tablet  Commonly known as: LIPITOR  Take 40 mg by mouth every evening.     B-D ASSURE BPM/AUTO ARM CUFF Misc  1 Device by Does not apply route daily.     carvedilol 12.5 MG tablet  Commonly known as: COREG  Take 1 tablet (12.5 mg total) by mouth 2 (two) times daily with a meal.     enalapril 5 MG tablet  Commonly known as: VASOTEC  Take 1 tablet (5 mg total) by mouth daily.     furosemide 20 MG tablet  Commonly known as: LASIX  Take 1 tablet (20 mg total) by mouth daily.     gabapentin 800 MG tablet  Commonly known as: NEURONTIN  Take 800 mg by mouth 4 (four) times daily.     Ipratropium-Albuterol 20-100 MCG/ACT Aers respimat  Commonly known as: COMBIVENT  Inhale 2 puffs into the lungs 2 (two) times daily as needed for wheezing or shortness of breath. Frequency:QID Dosage:0.0 Instructions: Note:Dose: 20-100 MCG     nitroGLYCERIN 0.4 MG SL tablet  Commonly known as: NITROSTAT  Place 1 tablet (0.4 mg total) under the tongue every 5 (five) minutes x 3  doses as needed for chest pain.     oxyCODONE-acetaminophen 10-325 MG per tablet  Commonly known as: PERCOCET  Take 1 tablet by mouth every 4 (four) hours as needed for pain.     SYMBICORT 160-4.5 MCG/ACT inhaler  Generic drug: budesonide-formoterol  Inhale 2 puffs into the lungs 2 (two) times daily.     ticagrelor 90 MG Tabs tablet  Commonly known as: BRILINTA  Take 1 tablet (90 mg total) by mouth 2 (two) times daily.     ticagrelor 90 MG Tabs tablet  Commonly known as: BRILINTA  Take 1 tablet (90 mg total) by mouth 2 (two) times daily.       Follow-up Information    Follow up with Lorine BearsMuhammad Arida, MD On 12/12/2013.   Specialty: Cardiology   Why: 10:15 AM   Contact information:   [address] [place] [id number] [phone]     TIME SPENT ON DISCHARGE, INCLUDING PHYSICIAN TIME: >30 MINUTES   Signed: Robbie LisSIMMONS, Ajeenah Heiny 12/01/2013, 1:21 PM

## 2013-12-01 NOTE — Progress Notes (Deleted)
  Echocardiogram 2D Echocardiogram has been performed.  Leta JunglingCooper, Lukasz Rogus M 12/01/2013, 10:23 AM

## 2013-12-01 NOTE — Progress Notes (Signed)
Discharge education, medication, prescriptions, and follow-up appts given to pt and reviewed, emphasized the importance of medication adherence with Brilenta, pt stated understanding, IV and tele removed, family notified of discharge, brilenta discount card given to pt Archie BalboaStein, Nimsi Males G, RN

## 2013-12-01 NOTE — Progress Notes (Signed)
CARDIAC REHAB PHASE I   PRE:  Rate/Rhythm: 72 SR  BP:  Supine:   Sitting: 129/71  Standing:    SaO2: 95%RA  MODE:  Ambulation: 550 ft   POST:  Rate/Rhythm: 73 SR  BP:  Supine:   Sitting: 129/62  Standing:    SaO2: 98%RA 0958-1013 Pt walked 550 ft independently with no CP. Tolerated well. Ready to go home. Re enforced not smoking.   Luetta Nuttingharlene Miasia Crabtree, RN BSN  12/01/2013 10:10 AM

## 2013-12-01 NOTE — Progress Notes (Signed)
SUBJECTIVE:   Breathing much better. No chest pain. Down 11 lbs of fluid. Ambulated with cardiac rehab, no difficulty. Committed to smoking cessation.  OBJECTIVE:   Vitals:   Filed Vitals:   11/30/13 2141 12/01/13 0614 12/01/13 0750 12/01/13 0934  BP: 105/68 132/74 137/65 140/62  Pulse: 77 68 64 68  Temp: 97.7 F (36.5 C) 97.8 F (36.6 C)    TempSrc: Oral Oral    Resp: 18 18    Height:      Weight:  219 lb 5.7 oz (99.5 kg)    SpO2: 98% 100%  100%   I&O's:    Intake/Output Summary (Last 24 hours) at 12/01/13 1002 Last data filed at 12/01/13 0934  Gross per 24 hour  Intake      3 ml  Output    200 ml  Net   -197 ml   TELEMETRY: Reviewed telemetry pt in NSR:     PHYSICAL EXAM General: Well developed, well nourished, in no acute distress Head:   Normal cephalic and atramatic  Lungs:   Clear to auscultation Heart:   HRRR S1 S2  No JVD.   Abdomen: abdomen soft and non-tender Msk:  Back normal,  Normal strength and tone for age. Extremities:   No edema.   Neuro: Alert and oriented. Psych:  Normal affect, responds appropriately Skin: no rash   LABS: Basic Metabolic Panel:  Recent Labs  69/62/9509/04/10 0231 11/30/13 0241  NA 141 144  K 3.8 3.1*  CL 103 103  CO2 23 24  GLUCOSE 91 85  BUN 11 15  CREATININE 0.63 0.68  CALCIUM 8.1* 8.5  MG  --  2.0   Liver Function Tests:  Recent Labs  11/29/13 0231  AST 16  ALT 9  ALKPHOS 91  BILITOT 0.4  PROT 6.2  ALBUMIN 2.9*   No results for input(s): LIPASE, AMYLASE in the last 72 hours. CBC:  Recent Labs  11/29/13 0231 11/30/13 0241  WBC 9.2 8.4  HGB 12.3* 12.9*  HCT 35.7* 38.0*  MCV 83.6 84.6  PLT 201 187   Cardiac Enzymes:  Recent Labs  11/28/13 2115 11/29/13 0231 11/29/13 0825  TROPONINI 3.09* 2.36* 1.80*   BNP: Invalid input(s): POCBNP D-Dimer: No results for input(s): DDIMER in the last 72 hours. Hemoglobin A1C: No results for input(s): HGBA1C in the last 72 hours. Fasting Lipid  Panel:  Recent Labs  11/29/13 0231  CHOL 127  HDL 32*  LDLCALC 63  TRIG 284161*  CHOLHDL 4.0   Thyroid Function Tests: No results for input(s): TSH, T4TOTAL, T3FREE, THYROIDAB in the last 72 hours.  Invalid input(s): FREET3 Anemia Panel: No results for input(s): VITAMINB12, FOLATE, FERRITIN, TIBC, IRON, RETICCTPCT in the last 72 hours. Coag Panel:   No results found for: INR, PROTIME  RADIOLOGY: Portable Chest Xray  11/29/2013   CLINICAL DATA:  Respiratory failure ; assess support tube position  EXAM: PORTABLE CHEST - 1 VIEW  COMPARISON:  Portable abdominal film of November 28, 2013  FINDINGS: The lungs are well-expanded. The interstitial markings are mildly increased bilaterally. The cardiac silhouette is top-normal in size. The pulmonary vascularity is not engorged. There is no significant pleural effusion. The observed bony thorax is unremarkable.  IMPRESSION:  Mildly increased interstitial markings bilaterally are consistent with interstitial edema. There is no alveolar pneumonia. The support tubes are in reasonable position.   Electronically Signed   By: David  Swazil[place]Jordan   On: 11/29/2013 07:45   Dg Abd Portable 1v  11/28/2013   CLINICAL DATA:  NG tube placement.  EXAM: PORTABLE ABDOMEN - 1 VIEW  COMPARISON:  None.  FINDINGS: The NG tube tip is in the body region of the stomach. The bowel gas pattern is unremarkable. Contrast is noted in both kidneys. The lung bases are grossly clear.  IMPRESSION: The NG tube is in the stomach.   Electronically Signed   By: Loralie ChampagneMark  Gallerani M.D.   On: 11/28/2013 22:20      ASSESSMENT: Roosvelt Harps/PLAN:    1) CAD/NSTEMI: s/p PCI of the circumflex.  Repeat cath showed patent stent with CTO of RCA.  Unclear as to why he had the VT and respiratory failure requiring intubation.  Stable at this point.   2) Recommended tobacco cessation. He is committed to quitting.  3) Chronic diastolic heart failure: Avoid excessive fluids.  Hypotension resolved.  Pulmonary edema has  improved, not clear that he needs ongoing diuretic therapy. Need LV assessment prior to discharge.  4) Hyperlipidemia: LDL 63. On atorvastatin.  5) Dispo - okay for discharge home today pending echo results. Will need early follow-up. Cardiac rehab at [facility].  Chrystie NoseKenneth C. Amari Burnsworth, MD, [facility] Attending Cardiologist [facility]   Chrystie NoseHILTY,Naje Rice C, MD  12/01/2013  10:02 AM

## 2013-12-01 NOTE — Progress Notes (Signed)
  Echocardiogram 2D Echocardiogram has been performed.  Leta JunglingCooper, Reinhardt Licausi M 12/01/2013, 10:59 AM

## 2013-12-01 NOTE — Plan of Care (Signed)
Problem: Phase II Progression Outcomes Goal: OOB to chair per PCI oders Outcome: Completed/Met Date Met:  12/01/13 Goal: No post PCI angina Outcome: Completed/Met Date Met:  12/01/13 Goal: Distal pulses equal baseline assessment Outcome: Completed/Met Date Met:  12/01/13 Goal: Discharge plan established Outcome: Completed/Met Date Met:  12/01/13

## 2013-12-02 ENCOUNTER — Encounter: Payer: Self-pay | Admitting: Cardiovascular Disease

## 2013-12-12 ENCOUNTER — Ambulatory Visit: Payer: Medicaid Other | Admitting: Cardiovascular Disease

## 2013-12-27 ENCOUNTER — Encounter: Payer: Self-pay | Admitting: *Deleted

## 2013-12-27 ENCOUNTER — Ambulatory Visit (INDEPENDENT_AMBULATORY_CARE_PROVIDER_SITE_OTHER): Payer: Medicaid Other | Admitting: Cardiovascular Disease

## 2013-12-27 ENCOUNTER — Encounter: Payer: Self-pay | Admitting: Cardiovascular Disease

## 2013-12-27 VITALS — BP 108/76 | HR 64 | Ht 70.0 in | Wt 224.2 lb

## 2013-12-27 DIAGNOSIS — I10 Essential (primary) hypertension: Secondary | ICD-10-CM

## 2013-12-27 DIAGNOSIS — Z9889 Other specified postprocedural states: Secondary | ICD-10-CM

## 2013-12-27 DIAGNOSIS — E785 Hyperlipidemia, unspecified: Secondary | ICD-10-CM | POA: Insufficient documentation

## 2013-12-27 DIAGNOSIS — I2510 Atherosclerotic heart disease of native coronary artery without angina pectoris: Secondary | ICD-10-CM

## 2013-12-27 DIAGNOSIS — I2121 ST elevation (STEMI) myocardial infarction involving left circumflex coronary artery: Secondary | ICD-10-CM

## 2013-12-27 DIAGNOSIS — I5022 Chronic systolic (congestive) heart failure: Secondary | ICD-10-CM | POA: Insufficient documentation

## 2013-12-27 DIAGNOSIS — Z720 Tobacco use: Secondary | ICD-10-CM

## 2013-12-27 MED ORDER — ENALAPRIL MALEATE 5 MG PO TABS: 5.0000 mg | ORAL_TABLET | Freq: Every day | ORAL | Status: DC

## 2013-12-27 MED ORDER — CARVEDILOL 12.5 MG PO TABS: 12.5000 mg | ORAL_TABLET | Freq: Two times a day (BID) | ORAL | Status: DC

## 2013-12-27 NOTE — Assessment & Plan Note (Signed)
Lab Results  Component Value Date   CHOL 127 11/29/2013   HDL 32* 11/29/2013   LDLCALC 63 11/29/2013   TRIG 161* 11/29/2013   CHOLHDL 4.0 11/29/2013   Continue treatment with atorvastatin.

## 2013-12-27 NOTE — Assessment & Plan Note (Signed)
I discussed with him the importance of smoking cessation. He is going to try to quit with a nicotine patch.

## 2013-12-27 NOTE — Assessment & Plan Note (Signed)
He is doing well after recent non-ST elevation myocardial infarction with subsequent drug-eluting stent placement to the left circumflex. Continue dual antiplatelet therapy for at least one year. I referred him to cardiac rehabilitation.

## 2013-12-27 NOTE — Assessment & Plan Note (Signed)
He is currently New York Heart Association class II. He appears to be euvolemic on small dose furosemide.

## 2013-12-27 NOTE — Patient Instructions (Signed)
Your medications were refilled.  Continue same medications.   Refer to cardiac rehab at [facility].   Follow up in 3 months.

## 2013-12-27 NOTE — Assessment & Plan Note (Signed)
Blood pressure is well controlled on current medications. 

## 2013-12-27 NOTE — Progress Notes (Signed)
HPI  This is a 58 year old male who is here today for a followup visit regarding coronary artery disease, chronic diastolic heart failure and labile hypertension.  He underwent diagnostic catheterization in November of 2013 secondary to a non-STEMI following back surgery. He was found to have a subtotal occlusion of the right coronary artery with good left-to-right collaterals and was medically managed. He was treated with Plavix for one year.  He presented to [facility] on 11/27/13 with NSTEMI.  He underwent a LHC and found to have an occluded codominant RCA and high-grade codominant proximal mid circumflex disease. He underwent stenting of his circumflex with a 4 mm x 23 mm long Xience drug-eluting stent postdilated with a 4.5 mm balloon with an excellent angiographic result. Several hours post procedure, he became suddenly hypertensive, diaphoretic and short of breath. He had runs of ventricular tachycardia and ultimately required intubation and sedation. [facility] was [facility] for cardiac catheterization to redefine his anatomy. He underwent a repeat cath by Dr. Allyson SabalBerry. This demonstrated a widely patent circumflex stent and mid LAD disease with only 50% stenosis. He improved with IV Lasix. He was successfully extubated. A 2D echo was obtained and demonstrated mildly reduced systolic function with an EF of 45-50% with mild inferior hypokinesis. Grade I diastolic dysfunction was also noted as well as mild MR.  He has been doing well since hospital discharge and reports no chest pain or significant dyspnea. He has been taking his medications regularly with no reported side effects. He has not started cardiac rehabilitation.   Allergies  Allergen Reactions  . Ibuprofen     Increases blood pressure  . Tramadol     Increases blood pressure     Current Outpatient Prescriptions on File Prior to Visit  Medication Sig Dispense Refill  . acetaminophen (TYLENOL) 500 MG tablet Take 1,000 mg  by mouth every 6 (six) hours as needed. pain    . ALPRAZolam (XANAX) 1 MG tablet Take 1 mg by mouth 2 (two) times daily.    Marland Kitchen. aspirin EC 81 MG tablet Take 81 mg by mouth daily.     Marland Kitchen. atorvastatin (LIPITOR) 40 MG tablet Take 40 mg by mouth every evening.    . Blood Pressure Monitoring (B-D ASSURE BPM/AUTO ARM CUFF) MISC 1 Device by Does not apply route daily. 1 each 0  . carvedilol (COREG) 12.5 MG tablet Take 1 tablet (12.5 mg total) by mouth 2 (two) times daily with a meal. 60 tablet 3  . enalapril (VASOTEC) 5 MG tablet Take 1 tablet (5 mg total) by mouth daily. 30 tablet 3  . Fluticasone-Salmeterol (ADVAIR DISKUS) 250-50 MCG/DOSE AEPB Inhale 1 puff into the lungs every 12 (twelve) hours.    . furosemide (LASIX) 20 MG tablet Take 1 tablet (20 mg total) by mouth daily. 30 tablet 5  . gabapentin (NEURONTIN) 800 MG tablet Take 800 mg by mouth 4 (four) times daily.    . Ipratropium-Albuterol (COMBIVENT) 20-100 MCG/ACT AERS respimat Inhale 2 puffs into the lungs 2 (two) times daily. Frequency:QID   Dosage:0.0     Instructions:  Note:Dose: 20-100 MCG    . nitroGLYCERIN (NITROSTAT) 0.4 MG SL tablet Place 1 tablet (0.4 mg total) under the tongue every 5 (five) minutes x 3 doses as needed for chest pain. 25 tablet 5  . oxyCODONE-acetaminophen (PERCOCET) 10-325 MG per tablet Take 1 tablet by mouth every 4 (four) hours as needed for pain.     . ticagrelor (BRILINTA) 90 MG  TABS tablet Take 1 tablet (90 mg total) by mouth 2 (two) times daily. 60 tablet 0   No current facility-administered medications on file prior to visit.     Past Medical History  Diagnosis Date  . Hypertension   . Degenerative joint disease of spine     a. 11/2011 s/p L4-L5 fusion.  . Chronic back pain   . Chronic diastolic CHF (congestive heart failure)   . COPD (chronic obstructive pulmonary disease)   . CAD (coronary artery disease)     a. 11/2011 NSTEMI/Cath: subtotal occlusion of the mid RCA with L->R collats, nl LV  fxn-->medical Rx;  . Nephrolithiasis   . Tobacco abuse     a. 80+ pack year hx  . Rupture of rotator cuff of shoulder     left  . Anemia   . MI (myocardial infarction)      Past Surgical History  Procedure Laterality Date  . Shoulder surgery    . Knee surgery    . Wrist surgery    . Nose surgery    . Carpal tunnel release    . Kidney stone      removed  . L4-l5 spine surgery  2013  . Cardiac catheterization  12/15/2011    NSTEMI - Subtotalled RCA - Med Rx  . Cardiac catheterization  11/28/2013     Family History  Problem Relation Age of Onset  . Family history unknown: Yes     History   Social History  . Marital Status: Single    Spouse Name: N/A    Number of Children: N/A  . Years of Education: N/A   Occupational History  . Not on file.   Social History Main Topics  . Smoking status: Current Every Day Smoker -- 0.50 packs/day for 42 years    Types: Cigarettes  . Smokeless tobacco: Not on file  . Alcohol Use: No     Comment: seldom  . Drug Use: No     Comment: past  . Sexual Activity: Not on file   Other Topics Concern  . Not on file   Social History Narrative      PHYSICAL EXAM   BP 108/76 mmHg  Pulse 64  Ht 5\' 10"  (1.778 m)  Wt 224 lb 4 oz (101.719 kg)  BMI 32.18 kg/m2 Constitutional: He is oriented to person, place, and time. He appears well-developed and well-nourished. No distress.  HENT: No nasal discharge.  Head: Normocephalic and atraumatic.  Eyes: Pupils are equal and round. Right eye exhibits no discharge. Left eye exhibits no discharge.  Neck: Normal range of motion. Neck supple. No JVD present. No thyromegaly present.  Cardiovascular: Normal rate, regular rhythm, normal heart sounds and. Exam reveals no gallop and no friction rub. No murmur heard.  Pulmonary/Chest: Effort normal and breath sounds normal. No stridor. No respiratory distress. He has no wheezes. He has no rales. He exhibits no tenderness.  Abdominal: Soft. Bowel  sounds are normal. He exhibits no distension. There is no tenderness. There is no rebound and no guarding.  Musculoskeletal: Normal range of motion. He exhibits trace edema and no tenderness.  Neurological: He is alert and oriented to person, place, and time. Coordination normal.  Skin: Skin is warm and dry. No rash noted. He is not diaphoretic. No erythema. No pallor.  Psychiatric: He has a normal mood and affect. His behavior is normal. Judgment and thought content normal.    EKG: Normal sinus rhythm with no significant ST or T  wave changes.   ASSESSMENT AND PLAN

## 2013-12-28 ENCOUNTER — Telehealth: Payer: Self-pay

## 2013-12-28 NOTE — Telephone Encounter (Signed)
Pt states he is not sure if he took his Brilinta this morning. He doesn't want to take another one. Please call.

## 2013-12-28 NOTE — Telephone Encounter (Signed)
[id number] is pt new number in case we called him today.

## 2013-12-28 NOTE — Telephone Encounter (Signed)
It is a twice daily medication. So he should take one this evening and resuming normal doing.

## 2013-12-28 NOTE — Telephone Encounter (Signed)
Instructed patient not to take a possible second dose until I inform Dr. Kirke CorinArida  Patient verbalized understanding

## 2013-12-29 NOTE — Telephone Encounter (Signed)
Informed patient of Dr. Aridas response  Patient verbalized understanding  

## 2014-01-04 ENCOUNTER — Telehealth: Payer: Self-pay | Admitting: *Deleted

## 2014-01-04 NOTE — Telephone Encounter (Signed)
Faxed cardiac rehab orders 

## 2014-01-05 ENCOUNTER — Encounter (HOSPITAL_COMMUNITY): Payer: Self-pay | Admitting: Cardiovascular Disease

## 2014-01-26 ENCOUNTER — Telehealth: Payer: Self-pay

## 2014-01-26 NOTE — Telephone Encounter (Signed)
Pt has some questions about his blood thinner. He would like to get a tatoo, and wants to know if it is ok to stop it for a couple of days. Please call and advise.

## 2014-01-26 NOTE — Telephone Encounter (Signed)
It is not safe to stop the blood thinner for at least another 11 months. He should not get a tattoo while he is taking blood thinners.

## 2014-01-26 NOTE — Telephone Encounter (Signed)
Spoke w/ pt.  Advised him of Dr. Jari SportsmanArida's recommendation.  He verbalizes understanding and would like to cancel appt for Jan, as he was going to come in to discuss this issue.  Pt will keep his appt for March.

## 2014-02-06 ENCOUNTER — Telehealth: Payer: Self-pay | Admitting: Cardiovascular Disease

## 2014-02-06 ENCOUNTER — Other Ambulatory Visit: Payer: Self-pay | Admitting: *Deleted

## 2014-02-06 MED ORDER — ATORVASTATIN CALCIUM 40 MG PO TABS
40.0000 mg | ORAL_TABLET | Freq: Every evening | ORAL | Status: DC
Start: 2014-02-06 — End: 2014-08-14

## 2014-02-06 NOTE — Telephone Encounter (Signed)
°  1. Which medications need to be refilled? Lipitor  2. Which pharmacy is medication to be sent to? [facility]   3. Do they need a 30 day or 90 day supply? 30 day is what they usually get    4. Would they like a call back once the medication has been sent to the pharmacy? No, we don't have to.

## 2014-02-07 ENCOUNTER — Ambulatory Visit: Payer: Medicaid Other | Admitting: Cardiovascular Disease

## 2014-02-11 ENCOUNTER — Emergency Department: Payer: Self-pay | Admitting: Emergency Medicine

## 2014-02-11 LAB — BASIC METABOLIC PANEL
Anion Gap: 5 — ABNORMAL LOW (ref 7–16)
BUN: 13 mg/dL (ref 7–18)
CALCIUM: 8.9 mg/dL (ref 8.5–10.1)
CHLORIDE: 103 mmol/L (ref 98–107)
CREATININE: 0.81 mg/dL (ref 0.60–1.30)
Co2: 32 mmol/L (ref 21–32)
EGFR (African American): >60
Glucose: 120 mg/dL — ABNORMAL HIGH (ref 65–99)
OSMOLALITY: 281 (ref 275–301)
Potassium: 3.8 mmol/L (ref 3.5–5.1)
SODIUM: 140 mmol/L (ref 136–145)

## 2014-02-11 LAB — CBC
HCT: 41.7 % (ref 40.0–52.0)
HGB: 13.6 g/dL (ref 13.0–18.0)
MCH: 30.4 pg (ref 26.0–34.0)
MCHC: 32.7 g/dL (ref 32.0–36.0)
MCV: 93 fL (ref 80–100)
Platelet: 175 10*3/uL (ref 150–440)
RBC: 4.48 10*6/uL (ref 4.40–5.90)
RDW: 14.3 % (ref 11.5–14.5)
WBC: 6 10*3/uL (ref 3.8–10.6)

## 2014-02-11 LAB — TROPONIN I: Troponin-I: <0.02 ng/mL

## 2014-02-11 LAB — PRO B NATRIURETIC PEPTIDE: B-Type Natriuretic Peptide: 417 pg/mL — ABNORMAL HIGH (ref 0–125)

## 2014-03-03 ENCOUNTER — Telehealth: Payer: Self-pay

## 2014-03-03 ENCOUNTER — Emergency Department: Payer: Self-pay | Admitting: Emergency Medicine

## 2014-03-03 NOTE — Telephone Encounter (Signed)
Patient called and stated that he was having severe back and side pain last night  He went to the ED and his blood pressure was 199/101  He stated no labs were done. They just took his blood pressure and told him to go to a pain clinic  His blood pressure today is 176/101 He is having a very hard time producing urine  He stated he is staying well hydrated  He is worried he is having an issue with his kidneys  He is calling his PCP or going to urgent care today   I informed him that I would discuss his symptoms with Dr. Kirke CorinArida  Instructed him see his PCP or go to urgent care  Contact EMS if he feel situation becomes emergent  Patient verbalized understanding

## 2014-03-03 NOTE — Telephone Encounter (Signed)
Do BMP if he can't see his PCP.

## 2014-03-03 NOTE — Telephone Encounter (Signed)
Patient stated he is on his way to PCP now to get labs  I instructed patient to call if we can help in anyway

## 2014-03-03 NOTE — Telephone Encounter (Signed)
Pt states he was in the ED last night for back /kidney pain and his BP was 199/101, he was sent home and went to [facility] and took it there now, and it was 176/101, states he has taken his BP medication. Please advise.

## 2014-03-28 ENCOUNTER — Encounter (INDEPENDENT_AMBULATORY_CARE_PROVIDER_SITE_OTHER): Payer: Self-pay

## 2014-03-28 ENCOUNTER — Ambulatory Visit (INDEPENDENT_AMBULATORY_CARE_PROVIDER_SITE_OTHER): Payer: Medicaid Other | Admitting: Cardiovascular Disease

## 2014-03-28 ENCOUNTER — Encounter: Payer: Self-pay | Admitting: Cardiovascular Disease

## 2014-03-28 VITALS — BP 78/58 | HR 55 | Ht 70.0 in | Wt 238.5 lb

## 2014-03-28 DIAGNOSIS — I2510 Atherosclerotic heart disease of native coronary artery without angina pectoris: Secondary | ICD-10-CM

## 2014-03-28 DIAGNOSIS — E785 Hyperlipidemia, unspecified: Secondary | ICD-10-CM

## 2014-03-28 DIAGNOSIS — G8929 Other chronic pain: Secondary | ICD-10-CM

## 2014-03-28 DIAGNOSIS — I5032 Chronic diastolic (congestive) heart failure: Secondary | ICD-10-CM

## 2014-03-28 DIAGNOSIS — I10 Essential (primary) hypertension: Secondary | ICD-10-CM

## 2014-03-28 DIAGNOSIS — I5022 Chronic systolic (congestive) heart failure: Secondary | ICD-10-CM

## 2014-03-28 DIAGNOSIS — M549 Dorsalgia, unspecified: Secondary | ICD-10-CM

## 2014-03-28 DIAGNOSIS — R0602 Shortness of breath: Secondary | ICD-10-CM

## 2014-03-28 MED ORDER — CARVEDILOL 6.25 MG PO TABS: 6.2500 mg | ORAL_TABLET | Freq: Two times a day (BID) | ORAL | Status: DC

## 2014-03-28 NOTE — Progress Notes (Signed)
HPI  This is a 59 year old male who is here today for a followup visit regarding coronary artery disease, chronic diastolic/systolic heart failure and labile hypertension.  He underwent diagnostic catheterization in November of 2013 secondary to a non-STEMI following back surgery. He was found to have a subtotal occlusion of the right coronary artery with good left-to-right collaterals and was medically managed. He was treated with Plavix for one year.  He presented to [facility] on 11/27/13 with NSTEMI.  He underwent a LHC and found to have an occluded codominant RCA and high-grade codominant proximal mid circumflex disease. He underwent stenting of his circumflex with a 4 mm x 23 mm long Xience drug-eluting stent postdilated with a 4.5 mm balloon with an excellent angiographic result. Several hours post procedure, he became suddenly hypertensive, diaphoretic and short of breath. He had runs of ventricular tachycardia and ultimately required intubation and sedation. He was urgently transferred to [facility] for cardiac catheterization to redefine his anatomy. He underwent a repeat cath by Dr. Allyson Sabal. This demonstrated a widely patent circumflex stent and mid LAD disease with only 50% stenosis. He improved with IV Lasix. He was successfully extubated. A 2D echo was obtained and demonstrated mildly reduced systolic function with an EF of 45-50% with mild inferior hypokinesis. Grade I diastolic dysfunction was also noted as well as mild MR.  He has been doing well since hospital discharge and reports no chest pain or significant dyspnea. He has been taking his medications regularly with no reported side effects.  He needs to have back surgery done and needs clearance.   Allergies  Allergen Reactions  . Ibuprofen     Increases blood pressure  . Tramadol     Increases blood pressure     Current Outpatient Prescriptions on File Prior to Visit  Medication Sig Dispense Refill  . acetaminophen (TYLENOL) 500  MG tablet Take 1,000 mg by mouth every 6 (six) hours as needed. pain    . ALPRAZolam (XANAX) 1 MG tablet Take 1 mg by mouth 2 (two) times daily.    Marland Kitchen aspirin EC 81 MG tablet Take 81 mg by mouth daily.     Marland Kitchen atorvastatin (LIPITOR) 40 MG tablet Take 1 tablet (40 mg total) by mouth every evening. 30 tablet 3  . Blood Pressure Monitoring (B-D ASSURE BPM/AUTO ARM CUFF) MISC 1 Device by Does not apply route daily. 1 each 0  . carvedilol (COREG) 12.5 MG tablet Take 1 tablet (12.5 mg total) by mouth 2 (two) times daily with a meal. 60 tablet 6  . enalapril (VASOTEC) 5 MG tablet Take 1 tablet (5 mg total) by mouth daily. 30 tablet 6  . Fluticasone-Salmeterol (ADVAIR DISKUS) 250-50 MCG/DOSE AEPB Inhale 1 puff into the lungs every 12 (twelve) hours.    . furosemide (LASIX) 20 MG tablet Take 1 tablet (20 mg total) by mouth daily. (Patient taking differently: Take 20 mg by mouth as needed. ) 30 tablet 5  . gabapentin (NEURONTIN) 800 MG tablet Take 800 mg by mouth 4 (four) times daily.    . Ipratropium-Albuterol (COMBIVENT) 20-100 MCG/ACT AERS respimat Inhale 2 puffs into the lungs 2 (two) times daily. Frequency:QID   Dosage:0.0     Instructions:  Note:Dose: 20-100 MCG    . nitroGLYCERIN (NITROSTAT) 0.4 MG SL tablet Place 1 tablet (0.4 mg total) under the tongue every 5 (five) minutes x 3 doses as needed for chest pain. 25 tablet 5  . oxyCODONE-acetaminophen (PERCOCET) 10-325 MG per tablet Take 1  tablet by mouth every 4 (four) hours as needed for pain.     . ticagrelor (BRILINTA) 90 MG TABS tablet Take 1 tablet (90 mg total) by mouth 2 (two) times daily. 60 tablet 0  . tiotropium (SPIRIVA) 18 MCG inhalation capsule Place 18 mcg into inhaler and inhale daily.     No current facility-administered medications on file prior to visit.     Past Medical History  Diagnosis Date  . Hypertension   . Degenerative joint disease of spine     a. 11/2011 s/p L4-L5 fusion.  . Chronic back pain   . Chronic diastolic CHF  (congestive heart failure)   . COPD (chronic obstructive pulmonary disease)   . CAD (coronary artery disease)     a. 11/2011 NSTEMI/Cath: subtotal occlusion of the mid RCA with L->R collats, nl LV fxn-->medical Rx;  . Nephrolithiasis   . Tobacco abuse     a. 80+ pack year hx  . Rupture of rotator cuff of shoulder     left  . Anemia   . MI (myocardial infarction)      Past Surgical History  Procedure Laterality Date  . Shoulder surgery    . Knee surgery    . Wrist surgery    . Nose surgery    . Carpal tunnel release    . Kidney stone      removed  . L4-l5 spine surgery  2013  . Cardiac catheterization  12/15/2011    NSTEMI - Subtotalled RCA - Med Rx  . Cardiac catheterization  11/28/2013  . Left heart catheterization with coronary angiogram N/A 11/28/2013    Procedure: LEFT HEART CATHETERIZATION WITH CORONARY ANGIOGRAM;  Surgeon: Runell GessJonathan J Berry, MD;  [facility];  Service: Cardiovascular;  Laterality: N/A;     Family History  Problem Relation Age of Onset  . Family history unknown: Yes     History   Social History  . Marital Status: Single    Spouse Name: N/A  . Number of Children: N/A  . Years of Education: N/A   Occupational History  . Not on file.   Social History Main Topics  . Smoking status: Current Every Day Smoker -- 0.50 packs/day for 42 years    Types: Cigarettes  . Smokeless tobacco: Not on file  . Alcohol Use: No     Comment: seldom  . Drug Use: No     Comment: past  . Sexual Activity: Not on file   Other Topics Concern  . Not on file   Social History Narrative      PHYSICAL EXAM   BP 78/58 mmHg  Pulse 55  Ht 5\' 10"  (1.778 m)  Wt 238 lb 8 oz (108.183 kg)  BMI 34.22 kg/m2 Constitutional: He is oriented to person, place, and time. He appears well-developed and well-nourished. No distress.  HENT: No nasal discharge.  Head: Normocephalic and atraumatic.  Eyes: Pupils are equal and round. Right eye exhibits no discharge.  Left eye exhibits no discharge.  Neck: Normal range of motion. Neck supple. No JVD present. No thyromegaly present.  Cardiovascular: Normal rate, regular rhythm, normal heart sounds and. Exam reveals no gallop and no friction rub. No murmur heard.  Pulmonary/Chest: Effort normal and breath sounds normal. No stridor. No respiratory distress. He has no wheezes. He has no rales. He exhibits no tenderness.  Abdominal: Soft. Bowel sounds are normal. He exhibits no distension. There is no tenderness. There is no rebound and no guarding.  Musculoskeletal:  Normal range of motion. He exhibits trace edema and no tenderness.  Neurological: He is alert and oriented to person, place, and time. Coordination normal.  Skin: Skin is warm and dry. No rash noted. He is not diaphoretic. No erythema. No pallor.  Psychiatric: He has a normal mood and affect. His behavior is normal. Judgment and thought content normal.    EKG: Sinus  Bradycardia  WITHIN NORMAL LIMITS   ASSESSMENT AND PLAN

## 2014-03-28 NOTE — Patient Instructions (Signed)
Decrease Carvedilol (Coreg) to 6.25 mg twice daily.  Continue other medications.   Your physician wants you to follow-up in: 4 months.  You will receive a reminder letter in the mail two months in advance. If you don't receive a letter, please call our office to schedule the follow-up appointment.

## 2014-04-02 NOTE — Assessment & Plan Note (Signed)
Blood pressure is low today but he seems to be asymptomatic and he mentions that he took his pain medication before the visit. I decreased the dose of carvedilol to 6.25 mg twice daily and asked him to hold today's dose.

## 2014-04-02 NOTE — Assessment & Plan Note (Signed)
Is doing reasonably well with no symptoms suggestive of angina. Continue medical therapy. Dual antiplatelet therapy cannot be interrupted attempted November 2016. This was explained to him today.

## 2014-04-02 NOTE — Assessment & Plan Note (Signed)
The patient is at low to moderate risk from a cardiac standpoint for back surgery. However, dual antiplatelet therapy cannot be interrupted at the present time until November 2016.

## 2014-04-02 NOTE — Assessment & Plan Note (Signed)
Lab Results  Component Value Date   CHOL 127 11/29/2013   HDL 32* 11/29/2013   LDLCALC 63 11/29/2013   TRIG 161* 11/29/2013   CHOLHDL 4.0 11/29/2013   Continue treatment with atorvastatin. LDL was at target.

## 2014-04-03 ENCOUNTER — Ambulatory Visit: Payer: Self-pay | Admitting: General Surgery

## 2014-05-16 NOTE — Consult Note (Signed)
General Aspect 59 year old Caucasian male with a history of hypertension, chronic back pain, spine degenerative joint disease status post L4 to L5 fusion surgery one week ago, and history of kidney stones who presented to the ED with bilateral leg swelling for one week, sob, cough. Cardiology was consulted for elevated cardiac enz, acute CHF.    He  had L4-L5 fusion surgery one week ago he started to have bilateral leg swelling from ankle to thigh area, cough with dark blood sputum, and shortness of breath for the past one week. In addition, he has weakness and weight gain. He mentioned that he had a fever of 102 one week ago. He reports having significant IVFs at the time of his surgery.  He cannot lie flat due to back surgery. He has been taking oxycodone on a daily basis for back pain especially after surgery. The patient denies any abdominal pain, nausea, vomiting, or diaphoresis. No urinary problem or diarrhea, melena, or bloody stool.    Present Illness . SOCIAL HISTORY: heavy smoker 2 packs a day for more than 40 years and decreased to 1 pack a day for the past two years. He stopped smoking after  the spine surgery.  Occasional alcohol drinking.  Used cocaine a long time ago. He is single and living with his friend and his sister.   FAMILY HISTORY: Cancer runs in his family, sister and brother have lung cancer, brain cancer. Mother had heart attack and cancer. Also, hypertension and diabetes runs in his family. No strokes.   Physical Exam:   GEN well developed, well nourished, no acute distress    HEENT red conjunctivae    NECK supple  No masses    RESP normal resp effort  clear BS    CARD Regular rate and rhythm  No murmur    ABD denies tenderness  soft    LYMPH negative neck    EXTR positive edema, trace pitting b/l LE    SKIN normal to palpation    NEURO motor/sensory function intact    PSYCH alert, A+O to time, place, person, good insight   Review of Systems:    Subjective/Chief Complaint SOB improving, edema b/l    General: Fatigue    Skin: No Complaints    ENT: No Complaints    Eyes: No Complaints    Neck: No Complaints    Respiratory: No Complaints    Cardiovascular: Chest pain or discomfort  Edema    Gastrointestinal: No Complaints    Genitourinary: No Complaints    Vascular: No Complaints    Musculoskeletal: back brace in place    Neurologic: No Complaints    Hematologic: No Complaints    Endocrine: No Complaints    Psychiatric: No Complaints    Review of Systems: All other systems were reviewed and found to be negative    Medications/Allergies Reviewed Medications/Allergies reviewed     Hypertension:    chronic back pain:    kidney stone:    L4-L5 fusion:    nasal surgery:    left knee surgery:    kidney stone removed:    riight wrist surgery 4 times with steel plate:    carpal tunnel:    3 rotator cuff surgeries:   Home Medications: Medication Instructions Status  hydrochlorothiazide 25 mg oral tablet 1 tab(s) orally once a day Active  enalapril 2.5 mg oral tablet 1 tab(s) orally once a day Active  OxyContin 20 mg oral tablet, extended release 2 tab(s) orally 2  times a day Active  oxycodone 5 mg oral tablet 1 tab(s) orally 3 times a day Active   Lab Results:  Thyroid:  17-Nov-13 05:42    Thyroid Stimulating Hormone 0.850 (0.45-4.50 (International Unit)  ----------------------- Pregnant patients have  different reference  ranges for TSH:  - - - - - - - - - -  Pregnant, first trimetser:  0.36 - 2.50 uIU/mL)  Hepatic:  16-Nov-13 21:42    Bilirubin, Total  2.0   Alkaline Phosphatase 133   SGPT (ALT)  741   SGOT (AST)  286   Total Protein, Serum 6.4   Albumin, Serum  2.8  Routine Chem:  16-Nov-13 21:42    Potassium, Serum  3.3   Result Comment TROPONIN - RESULTS VERIFIED BY REPEAT TESTING.  - C/ANGELA WATKINS/2240/12-13-11/[facility]  - READ-BACK PROCESS PERFORMED.  Result(s) reported on  13 Dec 2011 at 10:45PM.   Glucose, Serum  110   BUN  28   Creatinine (comp) 0.75   Sodium, Serum  134   Chloride, Serum  97   CO2, Serum 31   Calcium (Total), Serum  7.7   Osmolality (calc) 274   eGFR (African American) >60   eGFR (Non-African American) >60 (eGFR values <69m/min/1.73 m2 may be an indication of chronic kidney disease (CKD). Calculated eGFR is useful in patients with stable renal function. The eGFR calculation will not be reliable in acutely ill patients when serum creatinine is changing rapidly. It is not useful in  patients on dialysis. The eGFR calculation may not be applicable to patients at the low and high extremes of body sizes, pregnant women, and vegetarians.)   Anion Gap  6  17-Nov-13 05:42    Result Comment TROPONIN - RESULTS VERIFIED BY REPEAT TESTING.  - RESULT PREV C@ 2240 ON 12/13/11  - HP  Result(s) reported on 14 Dec 2011 at 07:08AM.  Cardiac:  16-Nov-13 21:42    Troponin I  2.20 (0.00-0.05 0.05 ng/mL or less: NEGATIVE  Repeat testing in 3-6 hrs  if clinically indicated. >0.05 ng/mL: POTENTIAL  MYOCARDIAL INJURY. Repeat  testing in 3-6 hrs if  clinically indicated. NOTE: An increase or decrease  of 30% or more on serial  testing suggests a  clinically important change)   CK, Total 215   CPK-MB, Serum  5.4 (Result(s) reported on 13 Dec 2011 at 10:37PM.)  17-Nov-13 05:42    Troponin I  2.30 (0.00-0.05 0.05 ng/mL or less: NEGATIVE  Repeat testing in 3-6 hrs  if clinically indicated. >0.05 ng/mL: POTENTIAL  MYOCARDIAL INJURY. Repeat  testing in 3-6 hrs if  clinically indicated. NOTE: An increase or decrease  of 30% or more on serial  testing suggests a  clinically important change)  Routine Hem:  16-Nov-13 21:42    WBC (CBC)  17.4   RBC (CBC)  3.24   Hemoglobin (CBC)  10.3   Hematocrit (CBC)  30.3   Platelet Count (CBC)  132   MCV 93   MCH 31.7   MCHC 33.9   RDW 13.7   EKG:   Interpretation EKG shows NSR with ST and T  wave ABN in V5, V6, I and AVL    Ibuprofen: Other  Tramadol: Other  Vital Signs/Nurse's Notes: **Vital Signs.:   17-Nov-13 11:12   Vital Signs Type Routine   Temperature Temperature (F) 98   Celsius 36.6   Pulse Pulse 81   Respirations Respirations 18   Systolic BP Systolic BP 90   Diastolic BP (mmHg) Diastolic  BP (mmHg) 61   Mean BP 70   Pulse Ox % Pulse Ox % 92   Pulse Ox Activity Level  At rest   Oxygen Delivery 2L     Impression 58 year old Caucasian male with a history of hypertension, chronic back pain, spine degenerative joint disease status post L4 to L5 fusion surgery one week ago, and history of kidney stones who presented to the ED with bilateral leg swelling for one week, sob, cough. Cardiology was consulted for elevated cardiac enz, acute CHF.   1) Elevated cardiac enz: SOB, edema echo with wall motion ABN, EKG ABN in anterolateral leads,  elevated cardiac enz, TNT >2 --Discussed options with him. He prefers cardiac cath. Unable to treadmill Will place orders for cardiac cath tomorrow --COntinue heparin infusion. BP low. Will use b-blockers cautiously hold ace  2) Smoking discussed smoking cessation  3)s/p lumbar fusion healing well per the patient. Active  4) Edema, LE Trace on exam.  Mild acute diastolic CHF Mildly elevated RVSP Gnetle diuresis.  5) SOB acute diastolic CHF unable to exclude COPD flare on ABX   Electronic Signatures: Ida Rogue (MD)  (Signed [phone] 15:25)  Authored: General Aspect/Present Illness, History and Physical Exam, Review of System, Past Medical History, Home Medications, Labs, EKG , Allergies, Vital Signs/Nurse's Notes, Impression/Plan   Last Updated: 17-Nov-13 15:25 by Ida Rogue (MD)

## 2014-05-16 NOTE — Consult Note (Signed)
General Aspect 59 year old Caucasian male with a history of hypertension, chronic back pain, spine degenerative joint disease status post L4 to L5 fusion surgery in November, and history of kidney stones who presented to the ED chest pain.  He was hospitalized at [facility] in November 1 week after back surgery for LE edema and mild heart failure. He was noted to have elevated cardiac enzymes. He underwent cardiac cath which showed a subtotally occluded mid RCA with good left to right collaterals and normal EF. He was treated medically.  His BP has been running high. PCP doubled Coreg.  He presented with chest pain : aching sensation worse with movements and sleeping on left side. No exertional tightness. He has chronic dyspnea likely related to active smoking.    Present Illness . SOCIAL HISTORY: heavy smoker 2 packs a day for more than 40 years and decreased to 1 pack a day for the past two years. He stopped smoking after  the spine surgery.  Occasional alcohol drinking.  Used cocaine a long time ago. He is single and living with his friend and his sister.   FAMILY HISTORY: Cancer runs in his family, sister and brother have lung cancer, brain cancer. Mother had heart attack and cancer. Also, hypertension and diabetes runs in his family. No strokes.   Physical Exam:   GEN well developed, well nourished, no acute distress    HEENT red conjunctivae    NECK supple  No masses    RESP normal resp effort  clear BS    CARD Regular rate and rhythm  No murmur    ABD denies tenderness  soft    LYMPH negative neck    EXTR positive edema, trace pitting b/l LE    SKIN normal to palpation    NEURO motor/sensory function intact    PSYCH alert, A+O to time, place, person, good insight   Review of Systems:   Subjective/Chief Complaint chest pain    General: Fatigue    Skin: No Complaints    ENT: No Complaints    Eyes: No Complaints    Neck: No Complaints    Respiratory: No Complaints     Cardiovascular: Chest pain or discomfort  Dyspnea  Edema    Gastrointestinal: No Complaints    Genitourinary: No Complaints    Vascular: No Complaints    Musculoskeletal: back brace in place    Neurologic: No Complaints    Hematologic: No Complaints    Endocrine: No Complaints    Psychiatric: No Complaints    Review of Systems: All other systems were reviewed and found to be negative    Medications/Allergies Reviewed Medications/Allergies reviewed   Lab Results: Routine Chem:  31-Dec-13 02:34    Glucose, Serum 88   BUN 15   Creatinine (comp) 0.82   Sodium, Serum 142   Potassium, Serum 3.9   Chloride, Serum  108   CO2, Serum 28   Calcium (Total), Serum 8.5   Anion Gap  6   Osmolality (calc) 283   eGFR (African American) >60   eGFR (Non-African American) >60 (eGFR values <58m/min/1.73 m2 may be an indication of chronic kidney disease (CKD). Calculated eGFR is useful in patients with stable renal function. The eGFR calculation will not be reliable in acutely ill patients when serum creatinine is changing rapidly. It is not useful in  patients on dialysis. The eGFR calculation may not be applicable to patients at the low and high extremes of body sizes, pregnant women, and  vegetarians.)  Cardiac:  31-Dec-13 02:34    CK, Total 48   CPK-MB, Serum 0.9 (Result(s) reported on 27 Jan 2012 at 03:14AM.)  Routine Hem:  31-Dec-13 02:34    WBC (CBC) 9.9   RBC (CBC)  3.67   Hemoglobin (CBC)  10.7   Hematocrit (CBC)  32.2   Platelet Count (CBC) 218   MCV 88   MCH 29.1   MCHC 33.1   RDW  15.6   Neutrophil % 56.3   Lymphocyte % 28.8   Monocyte % 10.5   Eosinophil % 3.6   Basophil % 0.8   Neutrophil # 5.5   Lymphocyte # 2.8   Monocyte # 1.0   Eosinophil # 0.4   Basophil # 0.1 (Result(s) reported on 27 Jan 2012 at 02:57AM.)   EKG:   Interpretation EKG shows NSR with ST and T wave ABN in V5, V6, I and AVL    Ibuprofen: Other  Tramadol: Other  Vital  Signs/Nurse's Notes: **Vital Signs.:   31-Dec-13 04:31   Vital Signs Type Routine   Temperature Temperature (F) 97.6   Temperature Source Oral   Pulse Pulse 75   Respirations Respirations 18   Systolic BP Systolic BP 578   Diastolic BP (mmHg) Diastolic BP (mmHg) 93   Mean BP 114   Pulse Ox % Pulse Ox % 96   Pulse Ox Activity Level  At rest   Oxygen Delivery Room Air/ 21 %    07:28   Temperature Temperature (F) 98   Celsius 36.6   Pulse Pulse 60   Respirations Respirations 18   Systolic BP Systolic BP 978   Diastolic BP (mmHg) Diastolic BP (mmHg) 82   Mean BP 107   Pulse Ox % Pulse Ox % 96   Pulse Ox Activity Level  At rest   Oxygen Delivery Room Air/ 21 %     Impression 1) atypical chest pain seems to be muscloskeletal: no furthur cardiac work up is recommended.   2) CAD with recent MI: no convincing symptoms of angina. His medications need to be optimized.  Increase Coreg to 12.5 mg bid, Lisinopril 5 mg daily. Change Pravastatin to Atorvastatin 40 mg. Resume Plavix 75 mg once daily.  Follow up with me in 2 weeks.   3)s/p lumbar fusion healing well per the patient. Active  ok to discharge home today.   Electronic Signatures: Kathlyn Sacramento (MD)  (Signed 31-Dec-13 09:08)  Authored: General Aspect/Present Illness, History and Physical Exam, Review of System, Labs, EKG , Allergies, Vital Signs/Nurse's Notes, Impression/Plan   Last Updated: 31-Dec-13 09:08 by Kathlyn Sacramento (MD)

## 2014-05-16 NOTE — Discharge Summary (Signed)
PATIENT NAME:  Walter [name], Walter [name] MR#:  [id number] DATE OF BIRTH:  January 25, 1956  DATE OF ADMISSION:  12/14/2011 DATE OF DISCHARGE:  12/16/2011   DISCHARGE DIAGNOSES:  1. Acute diastolic congestive heart failure due to non-ST-elevation myocardial infarction. 2. Pneumonia, community-acquired.  3. Chronic obstructive pulmonary disease exacerbation.  4. Lower back pain with recent L4-L5 fusion.  5. Tobacco abuse.   DISCHARGE MEDICATIONS:  1. Enalapril 2.5 mg p.o. daily.  2. OxyContin 20 mg p.o. 2 tablets p.o. b.i.d.  3. Oxycodone 5 mg p.o. t.i.d.  4. Micro-K 10 mEq p.o. b.i.d.  5. Atorvastatin 40 mg p.o. daily.  6. Furosemide 40 mg daily. 7. Tiotropium (Spiriva) 18 mcg inhalation daily.  8. Advair 250/50 one puff b.i.d.   9. Coreg 3.125 p.o. b.i.d.  10. Plavix 75 mg p.o. daily.   DIET: Low sodium, low fat diet.   CONSULTANTS: Cardiology consult with Dr. Kirke Corin and Dr. Ardelle Park COURSE: The patient is a 59 year old male patient who came in because of leg swelling, shortness of breath, and fever up to 102. The patient's oxygen sat was 94% on 2 liters of oxygen. He was admitted for acute CHF non-ST-elevation MI, pneumonia, and leukocytosis. 1. For pneumonia, the patient's x-ray showed bilateral pulmonary edema although pneumonia not excluded. He was admitted to telemetry. He was continued on oxygen and continued on IV antibiotics along with nebulizers. His white count was 17.4 on admission. Electrolytes were stable except potassium was 3.3. He had a CT of the chest which showed no CT evidence of PE but bilateral interstitial nodular density secondary to infectious or inflammatory versus edema versus fibrocystic pneumonitis. He had improvement in symptoms in terms of cough, white count normalized. He started on Spiriva and Advair on top of his nebulizers and Levaquin. His oxygen sats today are 93% on room air and afebrile since admission. He will be discharged with Levaquin, Albuterol,  Advair, and Spiriva. He was advised to quit smoking. He said he is already in the process. Last time he smoked was two weeks ago.  2. Acute CHF and non-ST-elevation MI. The patient's troponin was 2.2 with elevated CPK-MB 5.4. He was started on heparin drip for acute non-ST-elevation MI. The patient's troponin peaked up to 2.3. He was seen by cardiologist. The patient's echocardiogram showed EF low normal around 50 to 55%, septal dyskinesia, and apical wall dyskinesia. The patient was taken for cardiac cath yesterday and cardiac catheterization showed coronary circulation is right dominant, severe one vessel disease, subtotal occlusion of mid RCA with reasonable left to right collaterals. The vessel was heavily calcified and tortuous. Risks of PCI likely outweigh the benefits so recommended medical therapy. The patient's LAD had luminal irregularities as well as heavy calcification. There was tubular 30% stenosis; mid LAD has 30% stenosis. The patient was sent back to the room, started on aspirin and Plavix along with beta-blockers, Coreg, and statins. The patient denies chest pain. His shortness of breath improved. He was started on Lasix also because of his CHF symptoms. I spoke with Dr. Kirke Corin, cardiologist on call, and he said the patient can go home on aspirin, Plavix, Coreg, and Lasix. The patient wants to follow-up with his doctors at [facility] because he has no insurance and recommended to follow-up with them as scheduled. The patient needs to be on medications as described.  3. Elevated LFTs, possibly shock liver. The patient's AST was 286, ALT 741, alkaline phosphatase 133 on admission. He had an ultrasound of the abdomen  which was normal. No cholelithiasis, had hepatic steatosis. LFTs repeated today showed AST down to 67, ALT 271, alkaline phosphatase is normal. The patient was started on statin so he needs follow-up on his liver function study closely. He needs to [facility] and have a repeat BMP.   CONDITION ON DISCHARGE: The patient's condition is stable.   TIME SPENT ON DISCHARGE PREPARATION: More than 30 minutes.   ____________________________ Katha HammingSnehalatha Jisel Fleet, MD sk:drc D: 12/16/2011 09:29:45 ET T: 12/16/2011 11:30:57 ET JOB#: [id number]  cc: Katha HammingSnehalatha Damien Batty, MD, <Dictator> Katha HammingSNEHALATHA Jazmon Kos MD ELECTRONICALLY SIGNED 01/13/2012 15:00

## 2014-05-16 NOTE — H&P (Signed)
PATIENT NAME:  Walter [name], Walter [name] MR#:  [id number] DATE OF BIRTH:  August 01, 1955  DATE OF ADMISSION:  12/14/2011  PRIMARY CARE PHYSICIAN:  Non-local, [facility] REFERRING PHYSICIAN: Dr. Carollee Massed  CHIEF COMPLAINT: Leg swelling for one week, worsening today.   HISTORY OF PRESENT ILLNESS: The patient is a 59 year old Caucasian male with a history of hypertension, chronic back pain, spine degenerative joint disease status post L4 to L5 fusion surgery one week ago, and history of kidney stones who presented to the ED with bilateral leg swelling for one week, worsening today. The patient is alert, awake, and oriented. He said after he had L4-L5 fusion surgery one week ago he started to have bilateral leg swelling that is getting worse today, from ankle to thigh area. In addition, he has had a cough with dark blood sputum, and shortness of breath for the past one week. In addition, he has weakness and weight gain. He mentioned that he had a fever of 102 one week ago but he denies any dizziness, chest pain, palpitations, orthopnea, or nocturnal dyspnea. He cannot lie flat due to back surgery. He has been taking oxycodone on a daily basis for back pain especially after surgery. He said today he had a hallucination, seeing the clock running very fast, and also has double vision and blurred vision. The patient denies any abdominal pain, nausea, vomiting, or diaphoresis. No urinary problem or diarrhea, melena, or bloody stool.   PAST MEDICAL HISTORY:  1. Hypertension. 2. Spine degenerative joint disease. 3. Chronic back pain. 4. Kidney stones.  PAST SURGICAL HISTORY:  1. L4-L5 spine fusion surgery one week ago. 2. Nasal surgery.  3. Left knee surgery. 4. Kidney stone removal. 5. Carpal tunnel surgery.  6. Right wrist surgery times four.  7. Rotator cuff surgery. SOCIAL HISTORY: The patient was a heavy smoker 2 packs a day for more than 40 years and decreased to 1 pack a day for the past two years. He stopped  smoking three weeks ago after? the spine surgery.  Occasional alcohol drinking.  Used cocaine a long time ago. He is single and living with his friend and his sister.   FAMILY HISTORY: Cancer runs in his family, sister and brother have lung cancer, brain cancer. Mother had heart attack and cancer. Also, hypertension and diabetes runs in his family. No strokes.   MEDICATIONS: 1. OxyContin 20 mg p.o. 2 tablets b.i.d. 2. Oxycodone 5 mg p.o.1 tablet t.i.d. 3. Hydrochlorothiazide 25 mg p.o. daily.  4. Enalapril 2.5 mg p.o. daily.    ALLERGIES: Ibuprofen, tramadol.   REVIEW OF SYSTEMS: CONSTITUTIONAL: The patient has a fever but no chills. No headache or dizziness but has weakness and weight gain. EYES: Has double vision and blurred vision. ENT: No postnasal drip, epistaxis, oral thrush, or dysphagia. CARDIOVASCULAR: No chest pain, palpitations, orthopnea, or nocturnal dyspnea, but has bilateral leg edema. PULMONARY: Positive for cough, sputum, shortness of breath. No wheezing, but questionable hemoptysis. GI: No abdominal pain, nausea, vomiting, or diarrhea. No melena or bloody stools.  GU: No dysuria, hematuria, or incontinence. SKIN: No rash or jaundice. HEMATOLOGIC: No easy bruising or bleeding. NEUROLOGIC: No syncope, loss of consciousness, or seizure. ENDOCRINE: No polyuria, polydipsia, or heat or cold intolerance. PSYCH: No anxiety or depression.   PHYSICAL EXAMINATION: VITAL SIGNS: Temperature 98.2, blood pressure 106/70, pulse 86, oxygen saturation 94% on O2 by nasal cannula, respirations 18.   GENERAL: The patient is alert, awake, oriented, in no acute distress.   HEENT: Pupils round, equal,  reactive to light and accommodation. Dry oral mucosa. Clear oropharynx.   NECK: Supple. No JVD or carotid bruits. No lymphadenopathy, no thyromegaly.   CARDIOVASCULAR: S1 and S2. Regular rate and rhythm. No murmurs or gallop.   PULMONARY: Bilateral air entry, crackles and wheezing bilaterally  especially on the left side. No use of accessory muscles to breathe.   ABDOMEN: Soft, obese. Bowel sounds present. No tenderness or distention. It is difficult to determine if the patient has organomegaly due to equipment fixation around the abdomen and back.   EXTREMITIES: Bilateral leg edema 3+. No calf tenderness. Difficult to estimate whether the patient has pedal pulses.  No clubbing or cyanosis.   SKIN: No rash or jaundice.   NEUROLOGY: Alert and oriented times 3. No focal deficit. Power four out of five bilateral legs, five out of five bilateral arms. No focal deficit.   LABORATORY, DIAGNOSTIC, AND RADIOLOGICAL DATA: ABG showed pH 7.42, pCO2 50, pO2 71, bicarbonate 32.4. Lactic acid 0.7, WBC 7.4, hemoglobin 10.3, platelets 132, troponin 2.2. CK 215, CK-MB 5.4, glucose 110, BUN 28, creatinine 0.75, sodium 134, potassium 3.3, chloride 97, bicarbonate 31, bilirubin 2, alkaline phosphatase 133, SGPT 741, SGOT 286, albumin 2.8, anion gap 6. CT angio showed emphysema, diffuse bilateral pulmonary infiltrate suggestive of pulmonary edema although pneumonia is not excluded. Also fat infiltration of the liver. No mass. Some scattered mediastinal lymph nodes may be reactive nodes.   IMPRESSION:  1. Possible acute congestive heart failure.  2. Non-ST elevation myocardial infarction. 3. Pneumonia.  4. Leukocytosis.  5. Hypoxia.  6. Chronic obstructive pulmonary disease/ emphysema.  7. Dehydration.  8. Hypokalemia.  9. Hyponatremia.  10. Anemia.  11. Thrombocytopenia.  12. Abnormal liver function tests.  13. Fatty liver.  14. Obesity.  15. Tobacco abuse.    PLAN OF TREATMENT:  1. The patient will be admitted to the telemetry floor. We will continue O2 by nasal cannula. We will start Lasix 20 mg IV q.12 hours and start lisinopril, Coreg. Also we will get an echocardiograph and get a cardiology consult. Follow up BNP and start congestive heart failure protocol with fluid restriction, daily  weights, strict input and output.  2. For possible non-STEMI, we will start heparin drip and start aspirin, Plavix, and statin. Follow up troponin level, lipid panel, hemoglobin A1c. We should monitor CBC platelet count due to thrombocytopenia.  3. For possible pneumonia and leukocytosis, we will start Levaquin and start nebulizer, DuoNebs p.r.n. q. 4 h. and follow up CBC.  4. For hypokalemia we will give potassium supplement and follow up BMP and magnesium level.  5. For abnormal liver function tests and fatty liver we will get an ultrasound of the abdomen.  6. We will get a urine drug screen and alcohol level.  7. Smoking cessation was counseled for about six minutes.   I discussed the patient's critical situation with the patient.   TIME SPENT: About 75 minutes.   ____________________________ Shaune PollackQing Shery Wauneka, MD qc:bjt D: 12/14/2011 01:59:10 ET T: 12/14/2011 11:26:55 ET JOB#: [id number]  cc: Shaune PollackQing Tawsha Terrero, MD, <Dictator> Shaune PollackQING Tenasia Aull MD ELECTRONICALLY SIGNED 12/17/2011 1:31

## 2014-05-19 NOTE — Discharge Summary (Signed)
PATIENT NAME:  Walter [name], Walter [name] MR#:  [id number] DATE OF BIRTH:  10-20-1955  DATE OF ADMISSION:  01/26/2012 DATE OF DISCHARGE:  01/27/2012  REASON FOR ADMISSION: Chest pain.   FINAL DIAGNOSES: 1.  Atypical chest pain/pleuritic/musculoskeletal.  2.  Chronic back pain.  3.  Hypokalemia due to Lasix.  4.  Coronary artery disease.  5.  Chronic obstructive pulmonary disease.  6.  History of a non-ST elevation myocardial infarction prior to this hospitalization.   MEDICATIONS AT DISCHARGE: 1.  Furosemide 40 mg once daily. 2.  Tiotropium or Spiriva 18 mcg inhaled once daily.  3.  Advair Diskus 250/50 twice daily.  4.  Aspirin 81 mg once a day. 5.  Gabapentin 600 mg 3 times daily.  6.  Coreg 6.05 mg twice daily.  7.  Lipitor 40 mg once a day. 8.  Enalapril 5 mg once a day. 9. Acetaminophen with hydrocodone 325/5 mg every 4 hours as needed for pain, 1 week supply. 10.  Nitroglycerin p.r.n. chest pain. 11.  Hydrochlorothiazide 50 mg once a day. This is a new prescription as well.  His enalapril was increased to double; instead of 2.5 mg his Coreg was left the same due to heart rate in the 60s to 70s. The patient is to follow up with Dr. Mady GemmaMohammed Arida in the clinic within 2 weeks and with primary care doctor, Dr. Mila PalmerGloria Liu.  HOSPITAL COURSE:  The patient is a very nice 59 year old gentleman with history of coronary artery disease status post heart catheterization in November 2013.  He had a non-ST elevation myocardial infarction at the time, hen he was put on aspirin and Plavix. The patient comes with increased chest pain located mostly over the right side, 6/10 in nature, dull, nonradiating, very reproducible with moving side-to-side and reproducible with palpation.  The patient states that he has been under a lot of stress, sleeping very badly due to his back. He is status post laminectomy of the lumbar spine for what his position and has changed and he feels like he is pulling a lot of  muscles from different areas of his body. At this moment, we think the pain was completely atypical and that this was due to significant musculoskeletal pain.    The patient was evaluated by Dr. Kirke CorinArida during this hospitalization.  He determined that this was a musculoskeletal pain. The patient was actually recommended to continue Plavix since he was taken off by his primary care physician, and that he needs to stay on this for at least a year, as per Dr. Jari SportsmanArida's recommendations.   In the past in November whenever he came with acute diastolic congestive heart failure, which was a new onset, the patient was diagnosed with acute non-ST-elevation myocardial infarction. Cardiac catheterization showed at the time 50% to 55% septal dyskinesis and atypical wall dyskinesia.  He had severe 1-vessel disease with subtotal occlusion of the mid RCA, but with good collaterals.  The risk of PCI was greater than the benefits for what he did not have an intervention although he was told to continue the Plavix for a year due to this.    He has hypertension and when he showed up here, his blood pressure was significantly elevated up to 150s/98, for which his medications were adjusted.  Dr. Kirke CorinArida recommended to double his medications here.  He actually had doubled the lisinopril, but we added on hydrochlorothiazide at 50 mg.   The patient was discharged in good condition. Follow up with his primary  care physician and also Dr. Kirke Corin was going to set him up at the [facility] primary care physician on his next appointment.   TIME SPENT: I spent about 40 minutes with this discharge.    ____________________________ Felipa Furnace, MD rsg:ct D: 01/28/2012 06:45:12 ET T: 01/28/2012 07:21:17 ET JOB#: [id number]  cc: Felipa Furnace, MD, <Dictator> Mila Palmer, MD Hale Chalfin Juanda Chance MD ELECTRONICALLY SIGNED 01/29/2012 7:49

## 2014-05-19 NOTE — H&P (Signed)
PATIENT NAME:  Walter [name], [name] MR#:  [id number] DATE OF BIRTH:  02/12/1955  DATE OF ADMISSION:  02/14/2012  REFERRING PHYSICIAN: Dr. Lucrezia EuropeAllison Webster.  PRIMARY CARE PHYSICIAN: Dr. Mila PalmerGloria Liu.   CHIEF COMPLAINT: Lightheadedness and dizziness.   HISTORY OF PRESENT ILLNESS: This is a 59 year old male with significant past medical history of coronary artery disease, hypertension, hyperlipidemia, COPD, who presents with complaints of lightheadedness and dizziness. The patient reports his symptoms started yesterday when he started to feel dizzy and lightheaded upon working with some mild shortness of breath. Denies any chest pain, any palpitation, any diaphoresis. The patient was found to be orthostatic in the ED from blood pressure and heart rate point of view. The patient reports that over the last month he had his blood pressure medication progressively increased due to uncontrolled hypertension. He reports that his Coreg was increased from 3.125 gradually to 25 mg oral p.o. b.i.d. over the last 2 months. As well, his enalapril was increased from 5 to 20 mg oral daily. As well, he reports he has been taking Lasix 40 mg oral daily for lower extremity edema. The patient was found to have elevated creatinine to 1.5 from normal baseline. EKG showed normal sinus rhythm without any ST or T wave changes. Hospitalist services were requested to admit the patient for further management and assessment of his near-syncope.   PAST MEDICAL HISTORY:  1.  Hypertension.  2.  Chronic lower back pain.  3.  Kidney stones.  4.  COPD.  5.  Coronary artery disease.  6.  Hypertension.  7.  Hyperlipidemia.   PAST SURGICAL HISTORY:  1.  Lumbar spine surgery.  2.  Nasal surgery.  3.  Left knee surgery.  4.  Kidney stone removal.  5.  Carpal tunnel surgery.  6.  Right wrist surgery x 4.  7.  Rotator cuff surgery.   SOCIAL HISTORY: The patient smokes 1/2 pack per day, occasional alcohol use. Used to have history of  cocaine use a long time, nothing recent.   FAMILY HISTORY: Significant for lung cancer in his brother and sister and coronary artery disease in his mother.   ALLERGIES: IBUPROFEN AND TRAMADOL.   HOME MEDICATIONS:  1.  Enalapril 20 mg oral daily. 2.  Nitroglycerin as needed.  3.  Gabapentin 600 mg oral 3 times a day. 4.  Lipitor 40 mg oral at bedtime. 5.  Aspirin 81 mg daily. 6.  Plavix 75 mg daily. 7.  Coreg 25 mg p.o. b.i.d.  8.  Advair 1 puff 2 times a day.  9.  Spiriva inhaler once daily.  10.  Lasix 40 mg daily. 11.  Hydrochlorothiazide 50 mg oral daily.   REVIEW OF SYSTEMS:  CONSTITUTIONAL: Denies any fever, chills, fatigue or weakness.  EYES: Denies blurry vision, double vision, pain or inflammation.  ENT: Denies tinnitus, ear pain, hearing loss, epistaxis or discharge.  RESPIRATORY: Denies cough, wheezing, hemoptysis, asthma. Has complaints of mild dyspnea.  CARDIOVASCULAR: Denies any chest pain, edema, arrhythmia, palpitation. Had lightheadedness and dizziness.  GASTROINTESTINAL: Denies nausea, vomiting, diarrhea, abdominal pain, hematemesis, jaundice, rectal bleed.  GENITOURINARY: Denies dysuria, hematuria, renal colic.  ENDOCRINE: Denies polyuria, polydipsia, heat or cold intolerance.  HEMATOLOGIC: Denies anemia, easy bruising, bleeding diathesis. INTEGUMENTARY: Denies acne, rash or skin lesions.  MUSCULOSKELETAL: Has complaints of chronic lower back pain. Denies any cramps, gout, limited activity.  NEUROLOGIC: Denies numbness, dysarthria, epilepsy, tremors, ataxia, dementia, headache. Has complaints of lightheadedness and dizziness.  PSYCHIATRIC: Denies anxiety, insomnia, nervousness, schizophrenia,  bipolar disorder.   PHYSICAL EXAMINATION:  VITAL SIGNS: Temperature 98.3, pulse 74, respiratory rate 18, blood pressure 104/63, saturating 95% on room air. Orthostatic: Supine heart rate 77, blood pressure 100/55. Standing heart rate 90, blood pressure 74/48.  GENERAL:  Well-nourished male, looks comfortable, in no apparent distress.  HEENT: Head atraumatic, normocephalic. Pupils equal, reactive to light. Pink conjunctivae. Anicteric sclerae. Moist oral mucosa.  NECK: Supple. No thyromegaly. No JVD.  CHEST: Good air entry bilaterally. No wheezing, rales, rhonchi.  CARDIOVASCULAR: S1, S2 heard. No rubs, murmur, gallops.  ABDOMEN: Soft, nontender, nondistended. Bowel sounds present.  EXTREMITIES: No edema. No clubbing. No cyanosis.  PSYCHIATRIC: Appropriate affect. Awake, alert x 3. Intact judgment and insight.  NEUROLOGIC: Cranial nerves grossly intact. Motor 5/5. No focal deficits.  SKIN: Normal skin turgor. Warm and dry. No rash.   DIAGNOSTIC DATA: EKG showing normal sinus rhythm with no ST or T wave changes.   ASSESSMENT AND PLAN:  1.  Near-syncope: The patient was orthostatic in Emergency Department. Will continue to hold his blood pressure medicine. Will monitor him on telemonitor and will cycle his troponin. This is most likely related to his blood pressure medication, as his doses were significantly increased over the last 2 months. As well, he appears to be dehydrated in in acute renal failure. Will continue him on fluids.  2.  Acute renal failure: Will hold angiotensin-converting enzyme inhibitor and continue with his fluids. 3.  Coronary artery disease: Will continue with home medications, aspirin, Plavix, statin. Will resume beta blockers and lisinopril at lower dose when his orthostasis results.  4.  Chronic obstructive pulmonary disease: Not in acute exacerbation. Will have him on Advair and Spiriva and will have him on p.r.n. nebs  5.  Hypertension: Will resume his home medications at lower dose prior to discharge.  6.  Hyperlipidemia. Continue with statin.  7.  Deep vein thrombosis prophylaxis: Subcutaneous heparin. 8.  Gastrointestinal prophylaxis. On proton pump inhibitor.  CODE STATUS: Full code.   TOTAL TIME SPENT ON ADMISSION AND PATIENT  CARE: 55 minutes.   ____________________________ Starleen Arms, MD dse:jm D: 02/14/2012 04:34:45 ET T: 02/14/2012 18:46:36 ET JOB#: [id number]  cc: Starleen Arms, MD, <Dictator> Gionna Polak Teena Irani MD ELECTRONICALLY SIGNED 02/16/2012 22:27

## 2014-05-19 NOTE — Discharge Summary (Signed)
PATIENT NAME:  Walter [name], Walter [name] MR#:  [id number] DATE OF BIRTH:  09/24/1955  DATE OF ADMISSION:  02/14/2012 DATE OF DISCHARGE:  02/15/2012  PRIMARY CARE PHYSICIAN:   Mila PalmerGloria Liu, MD.  DISCHARGE DIAGNOSES:  1.  Near syncope, orthostatic hypotension.  2.  Acute renal failure.  3.  Hypertension.  4.  Chronic obstructive pulmonary disease.  5.  Coronary artery disease.   CONDITION: Stable.   CODE STATUS: Full code.   HOME MEDICATIONS: Tiotropium  18 mcg inhalation, one cap once a day; Advair 250 mcg -50 mcg inhalation 1 puff b.i.d., aspirin 81 mg p.o. daily; gabapentin 600 mg p.o. 3 times daily, Lipitor 40 mg p.o. at bedtime; nitroglycerin 0.4 mg sublingual tablet sublingual every five minutes p.r.n. for chest pain up to 3 times daily; Plavix 75 mg p.o. daily; enalapril 20 mg p.o. daily; Coreg 25 mg p.o. b.i.d.   STOP MEDICATION:  Lasix and hydrochlorothiazide.  DIET: Low sodium, low fat, low cholesterol diet.   ACTIVITY: As tolerated.   FOLLOWUP CARE: Followup with PCP within 1 to 2 weeks.   REASON FOR ADMISSION: Lightheadedness and dizziness.   HOSPITAL COURSE: The patient is a 59 year old Caucasian male with a history of CAD, hypertension, hyperlipidemia, COPD, who presented to the ED with lightheadedness and dizziness.  Recently, the patient's hypertension medication dose was increased. The patient's creatinine was 1.5 in the ED.  EKG showed normal sinus rhythm without ST or T-wave changes. The patient was admitted for near syncope, possibly due to orthostatic hypotension and acute renal failure. After admission, the patient's hypertensive medication was on hold due to acute renal failure and orthostatic hypotension. The patient was treated with IV fluid support. The patient's BUN decreased from 22 to 21 and creatinine decreased to 0.67. The patient has no complaints. Troponin level less than 0.02 for three times. The patient is clinically stable and will be discharged to home today. I  discussed the patient's discharge plan with the patient.   TIME SPENT:  About 30 minutes.   ____________________________ Shaune PollackQing Sharie Amorin, MD qc:eg D: 02/15/2012 12:41:15 ET T: 02/15/2012 18:28:16 ET JOB#: [id number]  cc: Shaune PollackQing Kissie Ziolkowski, MD, <Dictator> Shaune PollackQING Bronson Bressman MD ELECTRONICALLY SIGNED 02/17/2012 16:22

## 2014-05-19 NOTE — H&P (Signed)
PATIENT NAME:  Walter [name], [name] MR#:  [id number] DATE OF BIRTH:  1955/07/25  DATE OF ADMISSION:  12/19/2012  REFERRING PHYSICIAN: Dr. Shaune PollackLord.  PRIMARY CARE PHYSICIAN: Dr. Verdie MosherLiu  CHIEF COMPLAINT:  Worsening back pain.   HISTORY OF PRESENT ILLNESS: This is a very nice 59 year old gentleman who has history of chronic back pain that has been acute exacerbation within the past three days. The patient presented to the ER with a complaint of having worsening back pain in the lower spine located around L4-L5, which is the place that he had his previous surgery a year ago. He states the pain has been 10 out of 10, for which he has been using an ice pack. Yesterday he went to bed to sleep with an ice pack and this morning he woke up being numb on his arms and legs. The feeling came back after a while and it was likely related to the cold. The patient states that he does not have any numbness or tingling at this moment. The radiation of the pain is usually to the hip and the leg, which is the type of radiation that he always has on the right side. The pain is usually better with pain medications, but lately he has had to take more and it is worse by turning and moving around. The characteristic of the pain is described as sharp. He always has some dull pain and now has changes in characteristics. He has not had any associated incontinence of bowel  or urine. The patient states that he was having some fevers last week, initially, sometimes he was feeling hot and sweaty, but he was not able to take his temperature.   When the patient arrived to the Emergency Department he had a blood pressure  _____ , and suddenly became hypotensive 65/51, tachycardic up to 103. Receive a liter of IV fluids and is starting to improve. Right now his systolic blood pressure is above 100,  and he feels a little bit better after the fluids were given. Initially, the patient received 1 liter bolus and 500 bolus after that. The patient did  not take any pain medications this morning and he did not receive any pain medications here in the ER to blame it on that. The patient admitted with severe sepsis for evaluation of this problem.   REVIEW OF SYSTEMS: A 12-system review of systems: CONSTITUTIONAL: The patient states that he has a fever. He is very fatigued. He has increased pain. He feels weak on his legs. He had no significant weight loss or weight gain.  EYES: No blurry vision, double vision.   EARS, NOSE, THROAT: No tinnitus. No ear pain or difficulty hearing.  RESPIRATORY: Positive chronic cough and occasional wheezing due to his chronic obstructive pulmonary disease, but no signs of exacerbation at this moment. No significant changes in dyspnea, although he became a little bit hypoxemic during the time that he has his low blood pressure, down to 89%. He has been put on oxygen at 2.5  to 3 liters nasal cannula. Marland Kitchen.  CARDIOVASCULAR: No chest pain, orthopnea, edema, arrhythmias palpitations or syncope.  GASTROINTESTINAL: No nausea, vomiting, diarrhea. No abdominal pain. No melena or rectal bleeding.  GENITOURINARY: No dysuria, hematuria. The patient is in acute kidney injury with a creatinine above 3, which is new for him. He does not have any significant changes in his urine frequency. He states that he has been urinating okay.  ENDOCRINE: No polyuria, polydipsia, polyphagia, cold or heat intolerance.  HEMATOLOGIC AND LYMPHATIC: No anemia, easy bruising or swollen glands.  SKIN: No rashes, petechiae, or new moles.  MUSCULOSKELETAL: Positive chronic back pain. No significant swelling of any joints. No gout.  NEUROLOGIC: Positive numbness and tingling of the arms and legs that happened today after he woke up. He was using ice packs on his back, now resolved. He has some radiation of the pain to the right lower extremity from his back problems. He has not had a CVA or transient ischemic attack episode.  PSYCHIATRIC: No insomnia, no  depression, no anxiety.   PAST MEDICAL HISTORY: 1. Hypertension.  2. Chronic obstructive pulmonary disease.  3. Chronic lower back pain with degenerative joint disease.  4. History of kidney stones.  5. Chronic obstructive pulmonary disease with no exacerbation at this moment.  6. Coronary artery disease medical treatment by Dr.ARIDA 7. Hypertension.  8. Hyperlipidemia.  9. GERD.  10. Positive dyskinesis and akinesis of multiple walls on his heart. No systolic or diastolic dysfunction seen on the echo.  His last ejection fraction is 50% to 55%.   PAST SURGICAL HISTORY:  1. L4-L5 laminectomy in 11/2011.  2. Nasal surgery.  3. Knee surgery.  4. Carpal tunnel surgery on the right wrist x 4.  5. Kidney stone removal.  6. Rotator cuff surgery both shoulders.   ALLERGIES: IBUPROFEN AND TRAMADOL.   SOCIAL HISTORY: The patient smokes 1/2 pack a day. He says that he has been trying to quit. He states that he quit drinking a year ago but within the last week he has been drinking a little bit more 3 times a week. He drinks 4 to 5 vodka drinks every time that he drinks, again only 3 times this past week. 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   FAMILY HISTORY: Positive for MI in his mom, who had three heart attacks. Two of his brothers have had MIs at the age of 52s. His mother had kidney cancer. His sister has breast cancer.   CURRENT MEDICATIONS: Include: Ventolin  HFA 2 puffs every 4 hours, Tylenol 500 mg 2 tablets every 6 hours, Symbicort 160/4.5, 2 puffs twice daily. Prevacid 30 mg once a day, Percocet 10/325, 1 tablet 3 times a day, meloxicam 7.5 mg twice daily, gabapentin 800 mg 4 times a day, enalapril 5 mg once a day, Coreg 12.5 mg 2 times a day, atorvastatin 40 mg once a day, aspirin 81 mg daily, Advair Diskus 1 puff twice daily. The patient takes Advair and Symbicort for what we are just going to give him Symbicort and will stop Advair.    PHYSICAL EXAMINATION: VITAL SIGNS: His blood pressure on arrival was 112/53, it dropped down to 65/51. The patient was diaphoretic, lightheaded at that moment, and in some distress. His temperature was 98.4, pulse has been between 99  to 103, respirations in between 14 to 20. His oxygen saturation dropped to 89% at the time that he was having low blood pressure. His blood pressure now is above 98/58.  GENERAL: The patient is alert, oriented x 3. At this moment has no acute distress. He is overall stable.  HEENT: Pupils are equal and reactive. Extraocular movements are intact. Mucosa are dry. Anicteric sclerae. Pink conjunctivae. No oral lesions. No oropharyngeal exudates.  NECK: Supple. No JVD. No thyromegaly. No adenopathy. No carotid bruits. No rigidity.  HEART: Regular rate and rhythm. No murmurs, rubs or gallops are appreciated at this  moment. No displacement of PMI.   LUNGS: Overall clear without any wheezing or crepitus. No use of accessory muscles. Decreased respiratory sounds in both sides on the bases.  ABDOMEN: Obese, but no significant tenderness to palpation. No masses. No rebound tenderness. No guarding. No hepatosplenomegaly.  GENITAL: Negative for external lesions.  EXTREMITIES: No edema, cyanosis or clubbing. Pulses +2. Capillary refill less than 3.  MUSCULOSKELETAL: No significant joint effusions or joint deformity. There is significant tenderness to palpation at the level of L4-L5 paraspinal muscles. No tenderness to palpation over spinal processes.  SKIN: No rashes or petechiae. No mottling.  LYMPHATIC: Negative for lymphadenopathy in neck or supraclavicular areas.  VASCULAR: Pulses +2. Capillary refill is less than 3 seconds.  NEUROLOGIC: Cranial nerves II through XII intact. Deep tendon reflexes +2. Strength is 5/5 in four extremities. Straight leg on the right side, the patient raise his leg to 45 degrees and then after that, is starting to have some pain.  PSYCHIATRIC: Mood  is normal. The patient is alert, oriented x 3, in no acute distress.   LABORATORY, DIAGNOSTIC AND RADIOLOGICAL DATA: EKG normal sinus rhythm, sinus tachycardia, no ST depression or elevation at this moment.   His creatinine is 2.38. His BUN is 47, iron level is 33, glucose is 119. His CO2 is 25. Potassium is 4. His iron building capacity is 574. Iron saturation is 6%  Ferritin is pending. His total protein on previous labs is 7.1. We are going to recheck LFTs today   His troponin is slightly elevated at 0.17 and his total CK is only 338. No signs of rhabdomyolysis. No signs of myoglobin in urine. White count is 13.5, hemoglobin is 10.8, platelet count is 222, hypochromic microcytic anemia.   Previous urinalysis shows only 3 red blood cells, 1 white blood cell. No nitrites. No leukocyte esterase.   His B12 level is 333, which is normal low. His haptoglobin was tested previously, it was slightly high. His protein electrophoresis showed normal quantities of protein. No significant spikes. Urine electrophoresis was also overall normal.   ABG: Today. The patient is slightly acidotic with a 7.25, pCO2 was slightly elevated at 55. His pO2 was low at 58 and this is on 2 liters nasal cannula. His HCO3 is 24. His lactic acid is actually normal at 0.5.   ASSESSMENT AND PLAN: A 59 year old gentleman admitted due to severe sepsis, acute lower back pain, history of hypertension, chronic back pain, kidney stones, chronic obstructive pulmonary disease, coronary artery disease with medical treatment, hypertension, hyperlipidemia, new diagnosis of acute kidney injury as well.  1. Severe sepsis, the patient meets criteria for severe sepsis with elevation of white count of 213,000, drop of blood pressure below 90. Systolic his blood pressure was down to 60s over 40s.  He also has tachycardia above 100. His lactic acid is normal We are going to check coagulopathy. He does have also acute kidney injury which is likely due  to the process of sepsis/possible acute tubular necrosis.   The patient is started on broad-spectrum antibiotics, vancomycin and Zosyn. Blood cultures were taken. Urine culture was taken.   The patient does not seem to have an acute pulmonary  pathology. He does have chronic obstructive pulmonary disease, but he does not seem to be in any exacerbation. He is slightly hypoxic, but this could be related to sepsis in general. The patient is admitted, IV fluids given 1.5 liters. Right now, we are going to put him on 150 mL an  hour. Monitor his urine with a Foley catheter.   The patient starting to get better. His systolics are now above 100 and he seems to be more stable for what we are going to send him to the regular floor with telemetry monitoring.   2. Acute kidney injury. This is a new problem. The patient's baseline creatinine has been historically around 1.6, 1.8. His last creatinine on 10/13 was 1.34. Today is 3.38. We are going to continue IV fluids as this looks prerenal or secondary to sepsis, it may be the hypotension, so very likely this is acute tubular necrosis. We are going to consult nephrology, as the patient has the significant raise in his creatinine. Monitor urine with Foley catheter. We are going to send some tests, including ANA. He did have a urine and serum electrophoresis recently for what I do not think we need t  redo those. The patient has an order for an ultrasound of the kidneys as well as he has a history of nephrolithiasis in the past. His urine output seems to be stable right now, but we are going to monitor closely.   Avoid nephrotoxins. The patient takes lisinopril and NSAID for which we are going to stop those.   3. Acute lower back pain. The patient has history of chronic back pain, but this seems to be exacerbation with severe tenderness at the level of L4-L5. The patient has had surgery before there for which we are going to do CT scan to evaluate for for diskitis or  an abscess.   4. Hypertension. We are going to hold on blood pressure medications as he has significant lower blood pressure.  5. Chronic obstructive pulmonary disease. Does not seem to be any exacerbation for what we are just going to continue regular inhalers.  6. Hyperlipidemia. Continue statin.  7. Coronary artery disease. Continue aspirin, hold beta blocker as the patient is hypotensive, hold  ACE inhibitor,  8. Slight respiratory acidosis . The patient is obese, likely has sleep apnea, . His pCO2 is only 55, but his bicarb is  24, so it is likely compensated.  9. Anemia of chronic disease. The patient has history of anemia in the past. He is microcytic, hyperchromic, likely related also to iron deficiency. We are going to test higher levels as well.  10. Deep vein thrombosis prophylaxis with heparin. Gastrointestinal prophylaxis with proton pump inhibitor.   TIME SPENT: I spent about 60 minutes with this patient.critical  care  time as he has respiratory failure with decreased oxygen levels requiring replacement of oxygen and severe sepsis. Potential of decompensation. At this moment he is more stable after fluids, but we are going to keep a close monitoring on him.    ____________________________ Felipa Furnace, MD rsg:sg D: 12/19/2012 12:33:00 ET T: 12/19/2012 13:12:16 ET JOB#: [id number]  cc: Felipa Furnace, MD, <Dictator> Narmeen Kerper Juanda Chance MD ELECTRONICALLY SIGNED 12/20/2012 13:10

## 2014-05-19 NOTE — Discharge Summary (Signed)
PATIENT NAME:  Walter [name], Trusten E MR#:  [id number] DATE OF BIRTH:  1956/01/19  DATE OF ADMISSION:  12/19/2012 DATE OF DISCHARGE:  12/20/2012  PRIMARY CARE PHYSICIAN: Dr. Hassell DoneHowland.   The patient left AGAINST MEDICAL ADVICE.   REASON FOR ADMISSION: Worsening back pain.   HOSPITAL COURSE: The patient is a 59 year old gentleman with history of chronic back pain, hypertension, coronary artery disease, chronic obstructive pulmonary disease, presented to the ED with worsening back pain. The patient was noted to have hypotension at 65/51, tachycardia with heart rate up to 103. The patient received IV fluid support in the ED and was admitted for sepsis. For detailed history and physical examination, please refer to the admission note dictated by Dr. Mordecai MaesSanchez.   LABORATORY DATA on admission date: ABG: pH 7.25, pCO2 of 65, pO2 of 58 on 2 liters by nasal cannula, lactic acid 0.5.  HOSPITAL COURSE: 1.  Severe sepsis. After admission, the patient has been treated with IV fluid support, vancomycin and Zosyn. Blood culture and urine culture were sent.  2.  Hypotension has improved after IV fluid support.  3.  Acute renal failure. The patient's creatinine was 3.38 yesterday, but decreased to 1.14 today. BUN decreased from 47 to 31.  4.  Acute on chronic lower back pain. The patient got a CAT scan of the spine, which showed a soft tissue mass. We requested orthopedic surgeon consult and started pain control.  5.  Hypertension. The patient had hypotension, but patient's blood pressure is elevated today, so I resumed the Coreg and enalapril.  The patient's blood pressure is better. 6.  Elevated troponin with a history of coronary artery disease, which is possibly due to hypotension and acute renal failure. Cardiology consult suggested no further work-up.  7.  Chronic diastolic congestive heart failure, stable.  8.  Acute respiratory failure, improved.  DISPOSITION: The patient left AGAINST MEDICAL ADVICE just  now.  ____________________________ Shaune PollackQing Joselyn Edling, MD qc:cg D: 12/20/2012 16:25:03 ET T: 12/21/2012 00:41:34 ET JOB#: [id number]  cc: Shaune PollackQing Alexzia Kasler, MD, <Dictator> Shaune PollackQING Marticia Reifschneider MD ELECTRONICALLY SIGNED 12/21/2012 15:36

## 2014-05-19 NOTE — Consult Note (Signed)
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 Cardiology consulted for positive troponins.   He underwent diagnostic catheterization in November of 2013 secondary to a non-STEMI following back surgery. He was found to have a subtotal occlusion of the right coronary artery with good left-to-right collaterals and was medically managed. He was readmitted December 30 secondary to atypical chest pain hypertension and diastolic CHF. He ruled out for MI. He was optimized on medical therapy.   He has recurrent episodes of labile BPs. He suffers from recurrent lower extremity edema which usually responds to Lasix but he tends to become volume depleted quickly and induces hypotension. He was being prepped recently for colonoscopy but when he went to have the procedure done, he was noted to be hypotensive. He probably was volume depleted. He followed up with Dr. Fletcher Anon last month and denied any chest pain or dyspnea. His blood pressure is back to baseline. He was deemed to be stable from a cardiac perspective.  Two nights ago, he awoke with 10/10 low back pain radiating up his spine and into his neck. He placed an ice pack on his back and experienced bilateral arm and leg numbness. This improved gradually after the ice pack was removed. He denies chest pain during this time. He has not had exertional chest pain. DOE is at baseline. He does report "catching a cold" 4 days ago. He has a cough productive of white sputum. No fevers or chills. No PND, orthopnea, LE edema or abdominal distention. Given the severity of his back pain, he presented to the ED.   Present Illness In the ED, EKG revealed nonspecific ST changes. He had an episode of hypotension in the ED- 65/51 (at [place] on 11/23). HR jumped to 100s. He was given IVFs  with improvement. Initial troponin returned mildly elevated at 0.17, CK-MB 10.3. Notable labwork included acute renal insufficiency BUN 47/Cr 3.38 (resulted at 0650 11/23), hypocalcemia at 7.9, leukocytosis at 13.5, microcytic anemia at Hgb 10.8/34.4, MCV 75. He was admitted by the medicine team. Two subsequent troponins revealed an elevation, then downtrend at 0.62->0.53. UDS was unremarkable aside from opiates. TSH low at 0.085. BMP- BUN 31/Cr 1.14 this AM. Blood cultures x 2 w/o growth to date. Lipid panel as below. Lumbar CT had findings concerning for malignancy (ilial lytic lesion), no evidence of diskitis (see below). Renal u/s- no hydronephrosis. CXR- no acute abnormality, atherosclerosis.   PAST MEDICAL HISTORY: 1. HTN 2. COPD  3. Chronic lower back pain with DJF 4. History of kidney stones.  5. CAD 6. Hyperlipidemia.  7. GERD.  8. Chronic diastolic CHF   PAST SURGICAL HISTORY:  1. L4-L5 laminectomy in 11/2011.  2. Nasal surgery.  3. Knee surgery.  4. Carpal tunnel surgery on the right wrist x 4.  5. Kidney stone removal.  6. Rotator cuff surgery both shoulders.   ALLERGIES: IBUPROFEN AND TRAMADOL.   FAMILY HISTORY: Positive for MI in his mom, who had three heart attacks. Two of his brothers have had MIs at the age of 71s. His mother had kidney cancer. His sister has breast cancer.   SOCIAL HISTORY: 1/2 PPD. 80 + pack-year history. Recent EtOH use- 4-5 vodka drinks/night x 3 nights this week. Cocaine use 6 years ago.   Physical Exam:  GEN no acute distress, Musculoskeletal:  lumbar spine tenderness to palpation   HEENT pink conjunctivae, PERRL, hearing intact to voice   NECK supple  No masses  trachea midline  JVP 6-7 cm, no bruits   RESP normal resp effort  no use of accessory muscles  scattered wheezing and rhonchi, no appreciable rales   CARD Regular rate and rhythm  Normal, S1, S2  No murmur   ABD denies tenderness  soft   EXTR negative cyanosis/clubbing, negative  edema   SKIN normal to palpation, skin turgor good   NEURO follows commands, motor/sensory function intact   PSYCH alert, A+O to time, place, person   Review of Systems:  Subjective/Chief Complaint back painb   General: Trouble sleeping   Respiratory: Short of breath   Cardiovascular: Dyspnea   Review of Systems: All other systems were reviewed and found to be negative   Home Medications: Medication Instructions Status  aspirin 81 mg oral tablet, chewable 1 tab(s) orally once a day Active  Advair Diskus 100 mcg-50 mcg inhalation powder 1 puff(s) inhaled 2 times a day Active  carvedilol 12.5 mg oral tablet 1 tab(s) orally 2 times a day Active  gabapentin 800 mg oral tablet 1 tab(s) orally 4 times a day Active  atorvastatin 40 mg oral tablet 1 tab(s) orally once a day (at bedtime) Active  Ventolin HFA CFC free 90 mcg/inh inhalation aerosol 2 puff(s) inhaled 4 times a day as needed for shortness of breath/ wheezing.  Active  Percocet 10/325 325 mg-10 mg oral tablet 1 tab(s) orally 3 times a day  prn-for Pain Active  Symbicort 160 mcg-4.5 mcg/inh inhalation aerosol 2 puff(s) inhaled 2 times a day Active  meloxicam 7.5 mg oral tablet 1 tab(s) orally 2 times a day Active  Prevacid 30 mg oral delayed release capsule 1 cap(s) orally once a day Active  enalapril 5 mg oral tablet 1 tab(s) orally once a day Active  Tylenol 500 mg oral tablet 2 tab(s) orally every 6 hours Active   Lab Results:  Thyroid:  24-Nov-14 00:07   Thyroid Stimulating Hormone  0.085 (0.45-4.50 (International Unit)  ----------------------- Pregnant patients have  different reference  ranges for TSH:  - - - - - - - - - -  Pregnant, first trimetser:  0.36 - 2.50 uIU/mL)  Hepatic:  24-Nov-14 00:07   Bilirubin, Total 0.3  Alkaline Phosphatase  118 (45-117 NOTE: New Reference Range 12/17/12)  SGPT (ALT) 17  SGOT (AST) 22  Total Protein, Serum 6.4  Albumin, Serum  3.0  Routine Chem:  24-Nov-14 00:07    Glucose, Serum  142  BUN  31  Creatinine (comp) 1.14  Sodium, Serum 140  Potassium, Serum 3.9  Chloride, Serum 106  CO2, Serum 25  Calcium (Total), Serum  7.6  Osmolality (calc) 288  eGFR (African American) >60  eGFR (Non-African American) >60 (eGFR values <57m/min/1.73 m2 may be an indication of chronic kidney disease (CKD). Calculated eGFR is useful in patients with stable renal function. The eGFR calculation will not be reliable in acutely ill patients when serum creatinine is changing rapidly. It is not useful in  patients on dialysis. The eGFR calculation may not be applicable to patients at the low and high extremes of body sizes, pregnant women, and vegetarians.)  Anion Gap 9  Magnesium, Serum 2.2 (1.8-2.4 THERAPEUTIC RANGE: 4-7 mg/dL TOXIC: > 10 mg/dL  -----------------------)  Hemoglobin A1c ([facility]) 5.8 (The American Diabetes Association recommends that a primary goal of therapy should be <7% and that physicians should  reevaluate the treatment regimen in patients with HbA1c values consistently >8%.)  Cholesterol, Serum 109  Triglycerides, Serum  219  HDL (INHOUSE)  39  VLDL Cholesterol Calculated  44  LDL Cholesterol Calculated 26 (Result(s) reported on 20 Dec 2012 at 12:34AM.)  Urine Drugs:  [id number] 36:62   Tricyclic Antidepressant, Ur Qual (comp) NEGATIVE (Result(s) reported on 20 Dec 2012 at 12:56AM.)  Amphetamines, Urine Qual. NEGATIVE  MDMA, Urine Qual. NEGATIVE  Cocaine Metabolite, Urine Qual. NEGATIVE  Opiate, Urine qual POSITIVE  Phencyclidine, Urine Qual. NEGATIVE  Cannabinoid, Urine Qual. NEGATIVE  Barbiturates, Urine Qual. NEGATIVE  Benzodiazepine, Urine Qual. NEGATIVE (----------------- The URINE DRUG SCREEN provides only a preliminary, unconfirmed analytical test result and should not be used for non-medical  purposes.  Clinical consideration and professional judgment should be  applied to any positive drug screen result due to  possible interfering substances.  A more specific alternate chemical method must be used in order to obtain a confirmed analytical result.  Gas chromatography/mass spectrometry (GC/MS) is the preferred confirmatory method.)  Methadone, Urine Qual. NEGATIVE  Cardiac:  23-Nov-14 06:50   CPK-MB, Serum  11.5 (Result(s) reported on 19 Dec 2012 at 11:19AM.)  Troponin I  0.17 (0.00-0.05 0.05 ng/mL or less: NEGATIVE  Repeat testing in 3-6 hrs  if clinically indicated. >0.05 ng/mL: POTENTIAL  MYOCARDIAL INJURY. Repeat  testing in 3-6 hrs if  clinically indicated. NOTE: An increase or decrease  of 30% or more on serial  testing suggests a  clinically important change)  CK, Total  338 (Result(s) reported on 19 Dec 2012 at 11:19AM.)    15:52   Troponin I  0.62 (0.00-0.05 0.05 ng/mL or less: NEGATIVE  Repeat testing in 3-6 hrs  if clinically indicated. >0.05 ng/mL: POTENTIAL  MYOCARDIAL INJURY. Repeat  testing in 3-6 hrs if  clinically indicated. NOTE: An increase or decrease  of 30% or more on serial  testing suggests a  clinically important change)    16:04   CPK-MB, Serum  10.3 (Result(s) reported on 19 Dec 2012 at 05:05PM.)    19:33   CPK-MB, Serum  8.9 (Result(s) reported on 19 Dec 2012 at 10:17PM.)  Troponin I  0.59 (0.00-0.05 0.05 ng/mL or less: NEGATIVE  Repeat testing in 3-6 hrs  if clinically indicated. >0.05 ng/mL: POTENTIAL  MYOCARDIAL INJURY. Repeat  testing in 3-6 hrs if  clinically indicated. NOTE: An increase or decrease  of 30% or more on serial  testing suggests a  clinically important change)  24-Nov-14 00:07   CPK-MB, Serum  6.1 (Result(s) reported on 20 Dec 2012 at 12:41AM.)  Routine Hem:  24-Nov-14 00:07   WBC (CBC) 10.1  RBC (CBC)  3.79  Hemoglobin (CBC)  9.1  Hematocrit (CBC)  28.7  Platelet Count (CBC) 164  MCV  76  MCH  24.1  MCHC  31.8  RDW  19.3  Neutrophil % 80.0  Lymphocyte % 8.4  Monocyte % 9.3  Eosinophil % 1.9  Basophil % 0.4   Neutrophil #  8.1  Lymphocyte #  0.9  Monocyte # 0.9  Eosinophil # 0.2  Basophil # 0.0 (Result(s) reported on 20 Dec 2012 at 12:29AM.)   EKG:  Interpretation NSR, nonspecific ST changes   Rate 95   EKG Comparision Changed from  improved from 11/12/2012 tracing   Radiology Results: XRay:    23-Nov-14 08:39, Chest PA and Lateral  Chest PA and Lateral   REASON FOR EXAM:    abnormal troponin  COMMENTS:  PROCEDURE: DXR - DXR CHEST PA (OR AP) AND LATERAL  - Dec 19 2012  8:39AM     CLINICAL DATA:  COPD.  Congestive heart failure.  Abnormal troponin.    EXAM:  CHEST  2 VIEW    COMPARISON:  Chest x-ray 07/28/2012.    FINDINGS:  Lung volumes are normal. No consolidative airspace disease. No  pleural effusions. No pneumothorax. No pulmonary nodule or mass  noted. Pulmonary vasculature and the cardiomediastinal silhouette  are within normal limits. Atherosclerosis in the thoracic aorta.     IMPRESSION:  1.  No radiographic evidence of acute cardiopulmonary disease.  2. Atherosclerosis.      Electronically Signed    By: Vinnie Langton M.D.    On: 12/19/2012 08:44         Verified By: Etheleen Mayhew, M.D.,  CT:    23-Nov-14 13:05, CT Lumbar Spine Without Contrast  CT Lumbar Spine Without Contrast   REASON FOR EXAM:    please eval for diskitis. ( unable to give IV   contrast )  COMMENTS:       PROCEDURE: CT  - CT LUMBAR SPINE WO  - Dec 19 2012  1:05PM     CLINICAL DATA:  Back pain, clinical concern of discitis    EXAM:  CT LUMBAR SPINE WITHOUT CONTRAST    TECHNIQUE:  Multidetector CT imaging of the lumbar spine was performed without  intravenous contrast administration. Multiplanar CT image  reconstructions were also generated.  COMPARISON:  None.    FINDINGS:  Patient is status post posterior fusion of the L4-5 vertebra. The  hardware appears intact without evidence of loosening nor failure.    There is mild disc space narrowing at the L4-5 and  L5-S1 levels. Air  is appreciated within the intervertebral discs level at L5-S1. This  likely reflects a vacuum disc. There is no evidence of appreciable  free fluid surrounding the disc space nor increased soft tissue  density. Areas of endplate sclerosis are identified involving  inferior endplate of L5 and superior endplate of S1. These findings  are also more consistent with degenerative disc disease changes.  Disc space narrowing is also identified at the L2-3 level. Endplate  hypertrophic spurring posteriorly of the inferior endplate of L2.  There is no CT evidence suggesting paraspinous free fluid nor  significant paraspinous soft tissue prominence. There is no evidence  of cortical destruction.    Disc bulges are appreciated at the L3-4 and L4-5 levels appear to be  mild. The canal, lumbar, otherwise appears to be maintained.    Small punctate foci of air identified within the left sacroiliac  joint likely degenerative in origin.    Coarse vascular calcifications identified within the lower abdominal  aorta and iliac vessels.    A lytic lesion is appreciated within the posterior aspect of the  left iliac image 215 and series 2. A soft tissue component is  appreciated measuring 2.4 x 2.1 cm maximal orthogonal dimensions  image 212 series 2. This soft tissue lesion has aggressive appearing  components with regions of bony destruction. There is extension of  the soft tissue component into the gluteus musculature with areas of  stranding, mild, within the overlying fat.     IMPRESSION:  1. Concerning aggressive appearing soft tissue mass along the  posterior aspect ofthe iliac on the left. Further evaluation with  orthopedic consultation is recommended. Correlation with the  patient's neoplastic history is also recommended.  2. No definite  CT evidence of discitis. There are changes likely  reflecting degenerativedisc disease at the L5-S1 level. If there is  persistent  clinical concern further evaluation with MRI is  recommended.  Electronically Signed    By: Margaree Mackintosh M.D.    On: 12/19/2012 13:29         Verified By: Mikki Santee, M.D., MD    No Known Allergies:   Vital Signs/Nurse's Notes: **Vital Signs.:   24-Nov-14 05:16  Vital Signs Type Routine  Temperature Temperature (F) 98.5  Celsius 36.9  Temperature Source oral  Pulse Pulse 100  Respirations Respirations 20  Systolic BP Systolic BP 161  Diastolic BP (mmHg) Diastolic BP (mmHg) 82  Mean BP 108  Pulse Ox % Pulse Ox % 95  Pulse Ox Activity Level  At rest  Oxygen Delivery Room Air/ 21 %  *Intake and Output.:   Shift 24-Nov-14 15:00  Grand Totals Intake:   Output:  1500    Net:  -1500 52 Hr.:  -1500  Urine ml     Out:  1500  Length of Stay Totals Intake:  1385 Output:  4600    Net:  -0960    Impression 59 year old male w/ PMHx s/f CAD, chronic diastolic CHF, labile BP, 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 Cardiology consulted for positive troponins.   1. Mild troponin elevation The patient has a history of CAD which has been medically managed. He has a subtotal RCA lesion. He has not experienced exertional chest pain. He denies chest pain as part of his symptoms leading to this admission. SOB/DOE is at baseline. He thinks he may have caught a cold recently. He has known labile BP when he becomes hypovolemic. He admits to not wearing compression stockings as advised. In the ED, BP dropped to 65/51. This improved with aggressive fluid rescuscitation. Subsequently, BMP revealed an acute renal injury and mild troponemia. Suspect this was due to renal and possibly coronary underperfusion (DBP in the 50s, especially with known RCA lesion). Reduced renal excretion of TnI could have also played a role. He has not been experiencing chest pain and has been stable from a  cardiac standpoint otherwise. He has received significant IVFs since admission for suspected sepsis. Low suspicion for this. Trending toward volume overload. JVD evident on exam.  -- Reduce IVF -- Continue cardiac medications with hold parameters for ACEi/BB  2. Lytic lesion on CT Left iliac lytic lesion, aggressive appearing soft tissue mass noted on CT. No prior history of malignancy. Significant tobacco abuse. Orthopedic consult recommended by radiology.  -- Management per primary team  3. Chronic diastolic CHF Hypovolemic on admission, now trending towards hypervolemia. JVP 7 cm on exam.  -- Stop IVFs. Continue monitoring I/Os   Plan 4. Hypotension Hypotension accompanies hypovolemia. This has been well established in the past, and was the likely reason for his hypotension in the ED. He has received significant IVF with improvement.  -- Stop IVF -- Low suspicion for sepsis. Reactive leukocytosis. No fevers or chills. No evidence of diskitis. Hypotension likely due to hypovolemia. Improved with IVFs. Non-toxic appearing. -- Stressed adherence to using compression stockings  5. Acute renal insufficiency On admission, significantly improved with fluid hydration.   Electronic Signatures for Addendum Section:  Kathlyn Sacramento (MD) (Signed Addendum [phone] 10:37)  The patient was seen and examined. Agree with the above. He reports no chest pain or dyspnea.  Presented with back pain, hypotension and volume depletion. BY exam, there is now mild JVD.  Mildly elevated TnI likely due to supply demand ischemia with known underlying CAD. Stop IVFs. Continue work up for lytic lesion. No further cardiac work up is advised at this time.   Electronic Signatures: Meriel Pica (PA-C)  (Signed 24-Nov-14 10:32)  Authored: General Aspect/Present Illness, History and Physical Exam, Review of System, Home Medications, Labs, EKG , Radiology, Allergies, Vital Signs/Nurse's Notes, Impression/Plan Kathlyn Sacramento (MD)  (Signed [phone] 10:37)  Co-Signer: General Aspect/Present Illness, Home Medications, Allergies, Impression/Plan   Last Updated: 24-Nov-14 10:37 by Kathlyn Sacramento (MD)

## 2014-05-19 NOTE — H&P (Signed)
PATIENT NAME:  Walter [name], Neely E MR#:  [id number] DATE OF BIRTH:  Jun 18, 1955  DATE OF ADMISSION:  12/19/2012  ADDENDUM  The patient is a FULL CODE. The patient continues to smoke. Smoking cessation counseling given for over three minutes. The patient also started to drink again. At this moment, I do not see any significant reason to put him on detox protocol as he just started drinking this week and only drinks 3 times a week, but we are going to keep our eyes open for that. Also, the patient has history of cocaine use in the past. He states that he has been done in the past, but since he started to drink I would rather have urinalysis for drugs.    ____________________________ Felipa Furnaceoberto Sanchez Gutierrez, MD rsg:sg D: 12/19/2012 12:36:45 ET T: 12/19/2012 13:08:23 ET JOB#: [id number]  cc: Felipa Furnaceoberto Sanchez Gutierrez, MD, <Dictator> Jondavid Schreier Juanda ChanceSANCHEZ GUTIERRE MD ELECTRONICALLY SIGNED 12/20/2012 13:07

## 2014-05-19 NOTE — H&P (Signed)
PATIENT NAME:  Walter [name], Walter [name] MR#:  [id number] DATE OF BIRTH:  1955-10-01  DATE OF ADMISSION:  01/26/2012  PRIMARY CARE PHYSICIAN:  _____ Walter MosherLiu, MD   CARDIOLOGIST:  Walter AusMuhammad A. Walter CorinArida, MD  CODE STATUS:  Full code.   SURROGATE DECISION MAKER:  His brother, Walter [name].   CHIEF COMPLAINT:  Chest pain.   HISTORY OF PRESENT ILLNESS:  A 59 year old male with known coronary artery disease status post cardiac catheterization in 11/2011 who presents with intermittent chest pain over the last 24 hours. Pain is localized over the right side. It is 6 out of 10 in nature, characterized as a dull ache, nonradiating. He did not try any home therapies to improve the pain. It has been waxing and waning for the last 24 hours. This evening, he took his blood pressure medicines, cholesterol medicine and gabapentin and then presented to the Emergency Department. Upon arrival to the Emergency Department, he was noted to be minimally hypertensive initially with blood pressure 153/88; however, throughout his course in the ED, his blood pressure has risen to the point that it is actually 184/108 during my exam. Chest pain persisted, but improved slightly with Percocet in the Emergency Department. He took aspirin at home, 81 mg, and also received aspirin 324 mg in the Emergency Department. With the elevated blood pressure and ongoing chest pain, I did give him sublingual nitroglycerin with improvement of his blood pressure and chest pain from 8 out of 10 in intensity down to 6 out of 10 in intensity and blood pressure is now down to 153/98. We are called to admit the patient for evaluation of chest pain. History has been obtained from the patient, ED staff as well as reviewing past records.   ED physician has discussed the case with the cardiologist, Dr. Kirke [name], and he will see him in the morning.   The patient was admitted and managed in November for acute diastolic congestive heart failure with new onset. He at that  time had an acute non-ST elevation myocardial infarction. Cardiac catheterization at that time showed preserved ejection fraction 50% to 55%, septal dyskinesia and atypical wall dyskinesia. There was severe 1-vessel disease with subtotal occlusion of the mid RCA with collaterals so the risks of PCI were deemed to be greater than any benefits so medical management was recommended. He had been doing well up until the last 24 hours.   PAST MEDICAL HISTORY:   1.  Hypertension.  2.  Spinal degenerative joint disease.  3.  Chronic back pain status post back surgery.  4.  Kidney stones.  5.  Coronary artery disease.  6.  Possible COPD based on medications.  7.  Obesity.  8.  Diastolic congestive heart failure due to non-ST-elevation MI.   PAST SURGICAL HISTORY:   1.  L4-L5 spine surgery in 11/2011.  2.  Nasal surgery.  3.  Left knee surgery.  5.  Kidney stone removal.  6.  Carpal tunnel surgery.  7.  Right wrist surgery.  8.  Rotator cuff surgery.   ALLERGIES:  IBUPROFEN AND TRAMADOL.   MEDICATIONS:   1.  Aspirin 81 mg daily.  2.  Coreg 3.125 mg twice a day.  3.  Enalapril 2.5 mg daily.  4.  Fluticasone/salmeterol 250/50 mcg 1 puff twice a day.  5.  Furosemide 40 mg daily.  6.  Gabapentin 600 mg 3 times a day.  7.  Spiriva 18 mcg inhaled daily.   SOCIAL HISTORY:  The patient has smoked 1  to 2 packs daily for the last 40 years. Occasional alcohol. Denies active illicit drug use.   FAMILY HISTORY:  Notable for cancer. His mother had an MI. There is also a history of hypertension and diabetes.   REVIEW OF SYSTEMS: CONSTITUTIONAL: Denies fever, fatigue, weakness.  EYES: Denies blurred vision, double vision, eye pain.  ENT: No ear pain. No postnasal drip. No epistaxis.  RESPIRATORY: No cough, wheezing, painful respirations. No dyspnea.  CARDIOVASCULAR: Admits to chest as described. No orthopnea. No edema. No palpitations.  GASTROINTESTINAL: Denies nausea, vomiting, diarrhea, abdominal  pain, hematemesis. Denies diaphoresis.  GENITOURINARY: Denies frequency, urgency, dysuria.  ENDOCRINE: No sweats.  INTEGUMENTARY: No new skin rashes.  MUSCULOSKELETAL: He has chronic back pain. No cramps. No joint swelling. No gout.  NEUROLOGIC: Admits to chronic paresthesias after back surgery. He characterizes it as pins-and-needles on the plantar aspect of his left foot. No weakness. No headaches. No seizure.  PSYCHIATRIC: Denies depression or anxiety.   PHYSICAL EXAMINATION:  VITAL SIGNS: Blood pressure 153/98, respirations 20, oxygen saturation 95% on room air, temperature 98.2, heart rate 93.  GENERAL: Caucasian male in no apparent distress.  PSYCHIATRIC: Awake, alert, oriented x 3. Judgment appears intact.  EYES: Pupils are equal, round, and reactive to light and accommodation. Anicteric sclerae. Normal eyelids.  ENT: Normal external ears and nares. Posterior oropharynx is clear without erythema or thrush or exudate.  CARDIOVASCULAR: S1, S2, regular rate and rhythm. No murmurs appreciated. No pretibial edema.  PULMONARY: Clear to auscultation bilaterally. No wheezes, rales or rhonchi.  ABDOMEN: Soft, nontender, nondistended. No hepatomegaly is appreciated.  SKIN: Warm and dry. There is a lipoma on the back measuring about 1 cm in diameter on the left side.  MUSCULOSKELETAL: Full range of motion in bilateral upper and lower extremities. No clubbing, no cyanosis noted.   DIAGNOSTIC DATA:  1.  Glucose 101, BUN 12, creatinine 0.69, sodium 143, potassium 3.3, chloride 111, bicarbonate 24, calcium 8.6, anion gap 8, osmolality 285, GFR greater than 65. Troponin is less than 0.02. CK is 58, CK-MB 1.1.  2.  White count 9, hemoglobin 11.6, hematocrit 34.7, platelets 260. MCV 87.  3.  Chest x-ray shows no acute pulmonary abnormality.  4.  EKG shows sinus rhythm with premature atrial complexes at 99 beats per minute.   ASSESSMENT AND PLAN:  A 59 year old male with coronary artery disease status  post non-ST-segment elevation myocardial infarction in November presenting with chest pain found to have malignant hypertension. Given presentation of chest pain, I am concerned for hypertensive emergency versus repeat non-ST-segment elevation myocardial infarction.  1.  Chest pain with elevated changes with accelerated hypertension. At this time, we will add a nitroglycerin patch to relieve his chest pain. One-half inch has been provided after the sublingual nitroglycerin. We will increase his oral antihypertensive to 10 mg lisinopril, add hydrochlorothiazide, continue Coreg and as needed hydralazine will be ordered for systolic blood pressures greater than 180. He will be reassessed by cardiology in the morning for further recommendations. We will cycle cardiac enzymes. If he starts to have rising troponins, we will anticoagulate him therapeutically for concern of non-ST-elevation myocardial infarction. At this time, the troponin is currently negative and there are no signs of acute ST elevation myocardial infarction on the EKG.  2.  Chronic back pain. We will continue gabapentin.  3.  Hypokalemia. This is most likely due to Lasix and will replete this.  4.  Coronary artery disease. Continue Coreg, angiotensin-converting enzyme inhibitor. We will add  statin therapy. Continue aspirin.  5.  Chronic obstructive pulmonary disease, no acute exacerbation. She is to continue Advair and Spiriva.   6.  History of diastolic congestive heart failure. Stable.  The patient is being admitted for evaluation and management of chest pain.   TIME SPENT COORDINATING ADMISSION:  45 minutes.    ____________________________ Aurther Loft, DO aeo:si D: 01/26/2012 21:13:00 ET T: 01/26/2012 23:03:31 ET JOB#: [id number]  cc: Aurther Loft, DO, <Dictator> Muhammad A. Walter Corin, MD Zykerria Tanton [name] Azka Steger DO ELECTRONICALLY SIGNED 01/30/2012 5:07

## 2014-05-20 NOTE — Consult Note (Signed)
Brief Consult Note: Diagnosis: abdominal pain, possible ileus.   Patient was seen by consultant.   Consult note dictated.   Recommend further assessment or treatment.   Comments: 3 days of constant RUQ pain; no nausea, no vomiting. Has a BM typically daily, and he has had one since admission, and continues to pass flatus. Readmitted 2nd time in 2 weeks with non-cardiogenic shock. Takes 20 oxycodone q3h at home. abdominal XRay today read as mild adynamic ileus: by my review there is liquid stool in the cecum, solid stool and gas in the transverse colon, and gas in the L colon / rectum. Abdomen is mildly tender, but there is no evidence of peritoneal irritation (such as rebound or guarding) anywhere, including the RUQ. It has been a long time since his gallbladder has been evaluated. A - This is most likely opioid analgesic- (or narcotic-) induced bowel dysmotility, and or mild opioid withdrawal Sx, since his supervised dose is half (or less) what he took at home. Gallstones are possible too. P - RUQ U/S.  Electronic Signatures: Claude MangesMarterre, Kenji Mapel F (MD)  (Signed 14-Jun-15 19:47)  Authored: Brief Consult Note   Last Updated: 14-Jun-15 19:47 by Claude MangesMarterre, Jamell Laymon F (MD)

## 2014-05-20 NOTE — H&P (Signed)
PATIENT NAME:  Walter [name], Walter [name] MR#:  [id number] DATE OF BIRTH:  September 03, 1955  DATE OF ADMISSION:  11/27/2013  REFERRING PHYSICIAN: Loraine LericheMark R. Fanny BienQuale, MD   FAMILY PHYSICIAN: Dr. Ellsworth Lennoxejan-Sie    REASON FOR ADMISSION: Acute MI.  HISTORY OF PRESENT ILLNESS: The patient is a 59 year old male with a long-standing history of coronary artery disease and chronic diastolic CHF. Has chronic occlusion of his coronary arteries. Also has a history of COPD, tobacco abuse, chronic pain, and peripheral neuropathy. Presents to the Emergency Room today with 12-16 hour history of progressive chest pain and shortness of breath. Pain is described as a pressure in his chest and is nonradiating. Having some shortness of breath associated with this. No nausea, vomiting, or diaphoresis. Was brought to the Emergency Room where his symptoms were relieved with morphine and nitroglycerin. He continues to have 7/10 chest pain. His EKG did not show any ST elevation, but did show EKG changes. His troponin is elevated at 3.1, consistent with an acute non-ST elevation MI. He is now admitted for further evaluation.   PAST MEDICAL HISTORY: 1.  ASCVD, status post MI.  2.  Chronic coronary occlusion.  3.  Chronic diastolic congestive heart failure.  4.  Chronic obstructive pulmonary disease, tobacco abuse.  5.  Benign hypertension.  6.  Peripheral neuropathy.  7.  Chronic pain.  8.  Hyperlipidemia.  9.  Nephrolithiasis.  10.  Previous back surgery.  11.  History of carpal tunnel surgery.   MEDICATIONS: 1.  Advair 250/50 one puff b.i.d.  2.  ProAir 2 puffs q. 4 hours p.r.n. shortness of breath.  3.  Oxycodone 10 mg p.o. q. 6 hours.  4.  Gabapentin 800 mg p.o. q.i.d.  5.  Enalapril 5 mg p.o. daily.  6.  Coreg 12.5 mg p.o. b.i.d.  7.  Lipitor 40 mg p.o. at bedtime.  8.  Aspirin 81 mg p.o. daily.   ALLERGIES: No known drug allergies.   SOCIAL HISTORY: The patient quit drinking alcohol 2 years ago. Smokes 1 pack per day.    FAMILY HISTORY: Positive for coronary artery disease and stroke, but negative for prostate or colon cancer.    REVIEW OF SYSTEMS:  CONSTITUTIONAL: No fever or change in weight.  EYES: No blurred or double vision. No glaucoma.  EARS, NOSE AND THROAT: No tinnitus or hearing loss. No nasal discharge or bleeding. No difficulty swallowing.  RESPIRATORY: No cough or wheezing. Denies hemoptysis. CARDIOVASCULAR: No orthopnea or syncope.  GASTROINTESTINAL: No nausea, vomiting, or diarrhea. No abdominal pain. No change in bowel habits.  GENITOURINARY: No dysuria or hematuria. No incontinence.  ENDOCRINE: No polyuria or polydipsia. No heat or cold intolerance.  HEMATOLOGIC: The patient denies anemia, easy bruising, or bleeding.  LYMPHATIC: No swollen glands.  MUSCULOSKELETAL: The patient has pain in his back, but denies pain in his neck, shoulders, knees, or hips. No gout.  NEUROLOGIC: No numbness or migraines. Denies stroke or seizures.  PSYCHOLOGICAL: The patient denies anxiety, insomnia, or depression.   PHYSICAL EXAMINATION: GENERAL: The patient is in mild distress.  VITAL SIGNS: Remarkable for a blood pressure of 143/85 with a heart rate of 76, respiratory rate is 18, temperature of 98, saturating 95% on room air.  HEENT: Normocephalic, atraumatic. Pupils equal, round, and reactive to light and accommodation; extraocular movements are intact. Sclerae not icteric. Conjunctivae are clear.  Oropharynx is clear. NECK: Supple without JVD. No adenopathy or thyromegaly is noted.  LUNGS: Clear to auscultation and percussion without wheezes, rales, or  rhonchi. No dullness. Respiratory effort is normal.  CARDIAC: Regular rate and rhythm. Normal S1, S2. No significant rubs, murmurs, or gallops. PMI is nondisplaced. Chest wall is nontender.  ABDOMEN: Soft, nontender, with normoactive bowel sounds. No organomegaly or masses were appreciated. No hernias or bruits were noted.  EXTREMITIES: Without clubbing,  cyanosis, edema. Pulses were 2+ bilaterally.  SKIN: Warm and dry without rash or lesions.  NEUROLOGIC: Revealed cranial nerves II through XII grossly intact. Deep tendon reflexes were symmetric. Motor and sensory exam is nonfocal.  PSYCHIATRIC: Revealed a patient who was alert and oriented to person, place, and time. He was cooperative and used good judgment.   EKG revealed sinus rhythm with lateral ST segment depression. Chest x-ray revealed no acute disease.  LABORATORY DATA:  His white count was 11.7 with a hemoglobin of 14.0. His glucose 107 with a BUN of 11, a creatinine of 0.75 and a GFR of greater than 60. BNP was 4581 with a  CK-MB of 10.9 and a troponin of 3.1.   ASSESSMENT: 1.  Acute non-ST elevation myocardial infarction.  2.  Unstable angina.  3.  Chronic diastolic congestive heart failure. 4.  Benign hypertension.  5.  Chronic obstructive pulmonary disease and tobacco abuse.   PLAN: The patient will be admitted to intensive care unit as a full code with a heparin drip and a nitroglycerin drip. We will follow serial cardiac enzymes and obtain an echocardiogram. Will consult cardiology. Will make the patient n.p.o. after midnight in hopes of cardiac catheterization tomorrow. We will continue his outpatient pain regimen. Follow up routine labs and chest x-ray in the morning. Further treatment and evaluation will depend upon the patient's progress. Total   Time spent on this patient was 50 minutes.   ____________________________ Walter [name] Judithann Sheen, MD jds:LT D: 11/27/2013 16:20:41 ET T: 11/27/2013 16:58:34 ET JOB#: [id number]  cc: Walter [name]. Judithann Sheen, MD, <Dictator> Dr. Rolene Course Melaina Howerton D Darragh Nay MD ELECTRONICALLY SIGNED 11/27/2013 21:36

## 2014-05-20 NOTE — H&P (Signed)
Subjective/Chief Complaint Bilateral suprapubic pain, diarrhea, CT scan concerning for retained appendix with ? appendicitis   History of Present Illness Walter [name] is a pleasant 59 yo M who I know quite well with a history of ruptured appendicitis and subphrenic abscess s/p lap appendectomy with JP drain placement who presents with approx 1 month of persistent suprapubic pain and diarrhea.  Was recently admitted in early July after receiving a dose of IV dilaudid and developing resp failure.  According to him, he has had this pain following surgery.  C/o chills.  CT shows subphrenic fluid collection at the tip of a tubular structure concerning for stump/residual appendix.  Has up to 15 bm per day after eating.   Past History H/o lap appy with JP for ruptured appendicitis with subphrenic abscess H/O CAD/MI H/O CHF H/O hypercholesterolemia COPD/Tobacco use Chronic back pain, H/o L4/L5 fusion H/o nephrolithiasis H/o multiple orthopedic injuries   Past Medical Health Coronary Artery Disease, COPD   Code Status Full Code   Past Med/Surgical Hx:  tobacco abuse:   CAD: a. 11/2011 NSTEMI/Cath: LM min irregs, LAD 30p/m, LCX 62m, RCA 40p/44m, EF 60%--->Med Rx  chronic diastolic chf: a. 16/1096 Echo: EF 50-55%, septal DK, mod apical HK, ? lat HK.  hypotension:   obesity:   Syncope:   Peripheral Neuropathy:   Hypercholesterolemia:   Myocardial Infarct:   copd:   Hypertension:   chronic back pain:   kidney stone:   appendectomy: june 2015  L4-L5 fusion:   nasal surgery:   left knee surgery:   kidney stone removed:   riight wrist surgery 4 times with steel plate:   carpal tunnel:   3 rotator cuff surgeries:   ALLERGIES:  No Known Allergies:   Family and Social History:  Family History Hypertension  Diabetes Mellitus   Social History positive  tobacco, negative ETOH   + Tobacco Current (within 1 year)   Place of Living Home  Here with roomate   Review of Systems:   Subjective/Chief Complaint Suprapubic pain, diarrhea, chills   Fever/Chills Yes   Cough No   Sputum No   Abdominal Pain Yes   Diarrhea Yes   Constipation No   Nausea/Vomiting No   SOB/DOE No   Chest Pain No   Dysuria No   Tolerating Diet Yes   Physical Exam:  GEN well developed, well nourished, no acute distress   HEENT pink conjunctivae, PERRL, hearing intact to voice, good dentition   RESP normal resp effort  clear BS  no use of accessory muscles   CARD regular rate  no murmur  no thrills   ABD positive tenderness  denies Flank Tenderness  no hernia  soft  normal BS  Reports tenderness bilateral suprapubic without guarding, rebound   EXTR negative cyanosis/clubbing, negative edema   SKIN normal to palpation, No rashes, No ulcers, skin turgor good   NEURO cranial nerves intact, negative rigidity, negative tremor, follows commands, strength:, motor/sensory function intact   PSYCH A+O to time, place, person, good insight    Assessment/Admission Diagnosis Walter [name] is a pleasant 59 yo M with a history of ruptured appendicitis and subphrenic abscess who presents with approx 1 month of persistent diarrhea and suprapubic pain.  Reports pain with palpation without grimacing, peritonitis, rebound.  VSS.  WBC mildly elevated with neutrophil predominance.  Agree that tubular structure appears to look like residual stump appendix and initial surgery per my recollection was quite difficult but interesting that it was not there  on July CT.  Other alternatives could be persistent stump leak vs fluid in prior JP drain tract.  Clinical exam does not suggest stump appendicitis.   Plan Admit, treat as stump appendicitis vs residual abscess with IV abx, serial abd exams, stool for C diff and O and P.  Pain control. No surgical indications at this time.   Electronic Signatures: Jarvis NewcomerLundquist, Aariona Momon A (MD)  (Signed 11-Aug-15 18:29)  Authored: CHIEF COMPLAINT and HISTORY, PAST  MEDICAL/SURGIAL HISTORY, ALLERGIES, FAMILY AND SOCIAL HISTORY, REVIEW OF SYSTEMS, PHYSICAL EXAM, ASSESSMENT AND PLAN   Last Updated: 11-Aug-15 18:29 by Jarvis NewcomerLundquist, Jazalynn Mireles A (MD)

## 2014-05-20 NOTE — Op Note (Signed)
PATIENT NAME:  Walter [name], Walter [name] MR#:  [id number] DATE OF BIRTH:  1955-08-04  DATE OF PROCEDURE:  07/11/2013  PREOPERATIVE DIAGNOSIS: Ruptured appendicitis.  POSTOPERATIVE DIAGNOSES:   1. Ruptured appendicitis. 2. Subphrenic abscess.   SURGEON: Consuela Mimes, MD   ANESTHESIA: General.   PROCEDURE IN DETAIL: The patient was placed supine on the operating room table and prepped and draped in the usual sterile fashion. A 1-inch supraumbilical midline incision was made and carried down through the linea alba and a Hasson cannula was introduced amidst horizontal mattress sutures of 0 Vicryl and a 15-mmHg CO2 pneumoperitoneum was created. Three additional 5-mm trocars were placed under direct visualization. The patient was noted to have significant inflammation in the right upper quadrant with fibrinous exudate surrounding the free edge of the right lobe of the liver and gallbladder and a significant amount of fibrinous exudate along the visceral surface of the liver as well as the parietal surface of the liver, and the parietal peritoneum in that area. The omentum was over top of the liver and was involved in this process and the vast majority of the fibrinous exudate was removed with the suction. The patient also had a significant right subphrenic abscess with greater than 100 mL of pus present in this area. The appendix was not readily visualized and the terminal ileum and where it encountered the cecum at the ileocecal valve was found as was some antimesenteric fat in the usual location and ultimately, I found the base of the appendix which was necrotic in a retrocecal location and it was headed cephalad along the gutter in a retrocecal fashion headed up the ascending colon. I also found an area of necrosis near the tip of the appendix with an appendicolith and I removed the appendicolith separately, and then dissected the appendix out from its retrocecal location and performed the appendectomy  with the Endo GIA stapling device at the base of the appendix just where it met the cecum. In this area it was viable. I placed the appendix in an EndoCatch bag and extracted it from the abdomen via the supraumbilical port site. I irrigated the right upper quadrant with over a liter of warm normal saline until the effluent was completely clear and vast majority of the fibrinous exudate had been removed. There was some bleeding, but this had stopped and hemostasis was excellent at the conclusion of the appendectomy and abscess drainage. The pelvis was then irrigated with another 500 mL of warm normal saline until the effluent was clear and there was no evidence of any abscesses anywhere else in the patient's peritoneal cavity. A 19 [place] Blake drain was introduced and withdrawn through the right lower quadrant trocar site such that the Denning drain traveled up the appendiceal groove alongside the ascending colon and then went over the liver up into the subphrenic abscess area. I secured this to the skin with a 2-0 nylon suture and placed it to bulb suction and desufflated and decannulated the abdomen. I placed a 0 PDS in the linea alba between the horizontal mattress sutures, and then tied the horizontal mattress sutures one to another and closed the remaining trocar sites loosely with skin stapling device. The patient tolerated the procedure well. There were no complications.    ____________________________ Consuela Mimes, MD wfm:lt D: 07/11/2013 00:44:35 ET T: 07/11/2013 01:27:38 ET JOB#: [id number]  cc: Consuela Mimes, MD, <Dictator> Consuela Mimes MD ELECTRONICALLY SIGNED 07/11/2013 4:37

## 2014-05-20 NOTE — Consult Note (Signed)
General Aspect 59 y/o male w/ a h/o CAD, COPD, and chronic diast chf, who presented to the ED yesterday with a 2 day h/o intermittent dizziness and was found to be hypotensive. ***************************************************   Present Illness . 59 y/o male with a h/o chronic back pain, CAD s/p NSTEMI in 11/2011 (medically managed), chronic diast chf (EF 50-55% by echo 11/2011), HTN, HL, DJD of the spine, ongoing tobaccoa abuse and COPD, obesitym and FH of CAD.  He does not generally experience c/p or doe, though his activity is very limited by his back pain.  He was in his USOH until 2 days ago, when he was driving to a convenience store and developed severe dizziness, saying that he felt like he was drunk.  He was not having c/p, dyspnea, n/v, diaphoresis.  He got to the convenience store and bought some scratch off tickets and went back out to his car.  He says that he must of lost consciousness while in his car b/c he awoke lying in the driver's seat and went inside and asked the clerk how long he had been there and they guessed it was ~ 45 mins.  He was no longer dizzy and drove home.  Yesterday, he became dizzy again while driving to [facility] to pick up his brother, who had a procedure.  Upon arrival here, he presented to the ED where he was found to be markedly hypotensive with pressures in the mid-60's to 70's.  ECG was non-acute.  CTA of chest was neg for PE.  Trops were neg.  BC NGTD.  He was admitted to CCU and placed on IVF and initially dopamine.  Now off dopamine and BP's stable.  Trops remain neg.  Though he did have mild, fleeting c/p at times yesterday, he is currently pain free and w/o dyspnea.  He is feeling much better and is hemodynamically stable.   Physical Exam:  GEN well developed, pleasant, nad.   HEENT pink conjunctivae, moist oral mucosa   NECK supple  No masses  no bruits, obese - difficult to assess jvd.   RESP normal resp effort  diminsihed breath sounds bilat.    CARD Regular rate and rhythm  Normal, S1, S2  No murmur   ABD denies tenderness  soft  normal BS   LYMPH negative neck   EXTR negative cyanosis/clubbing, negative edema   SKIN normal to palpation   NEURO motor/sensory function intact, grossly intact, nonfocal.   PSYCH alert, A+O to time, place, person, good insight   Review of Systems:  Subjective/Chief Complaint lightheaded, back pain   General: dizziness   Skin: No Complaints   ENT: No Complaints   Eyes: No Complaints   Neck: No Complaints   Respiratory: No Complaints   Cardiovascular: mild, fleeting, c/p yesterday.   Gastrointestinal: No Complaints   Genitourinary: No Complaints   Vascular: No Complaints   Musculoskeletal: No Complaints   Neurologic: No Complaints   Hematologic: No Complaints   Endocrine: No Complaints   Psychiatric: No Complaints   Review of Systems: All other systems were reviewed and found to be negative   Medications/Allergies Reviewed Medications/Allergies reviewed   Family & Social History:  Family and Social History:  Family History Coronary Artery Disease  Mother: MI, renal CA, Brother MI @ 3, Sister breast CA, 3 brothers with MI's, + FH of DM/HTN.   Social History positive  tobacco (Current within 1 year), still smoking 1 ppd , prev up to 2ppd - 40 yrs.  No ETOH/Drugs.   Place of Living Home  Lives in [place] with his brother and sister.  Not very active @ home 2/2 back pain.     tobacco abuse:    CAD: a. 11/2011 NSTEMI/Cath: LM min irregs, LAD 30p/m, LCX 19m RCA 40p/931mEF 60%--->Med Rx   chronic diastolic chf: a. 1177/1165cho: EF 50-55%, septal DK, mod apical HK, ? lat HK.   hypotension:    obesity:    Syncope:    Peripheral Neuropathy:    Hypercholesterolemia:    Myocardial Infarct:    copd:    Hypertension:    chronic back pain:    kidney stone:    L4-L5 fusion:    nasal surgery:    left knee surgery:    kidney stone removed:     riight wrist surgery 4 times with steel plate:    carpal tunnel:    3 rotator cuff surgeries:   Home Medications: Medication Instructions Status  aspirin 81 mg oral tablet, chewable 1 tab(s) orally once a day Active  carvedilol 12.5 mg oral tablet 1 tab(s) orally 2 times a day Active  Symbicort 160 mcg-4.5 mcg/inh inhalation aerosol 2 puff(s) inhaled 2 times a day Active  enalapril 5 mg oral tablet 1 tab(s) orally once a day Active  Advair Diskus 250 mcg-50 mcg inhalation powder 1 puff(s) inhaled 2 times a day Active  Percocet 10/325 325 mg-10 mg oral tablet 2 tab(s) orally 3 times per day, As Needed -for Pain Active  Xanax XR 1 mg oral tablet, extended release 1 tab(s) orally 2 times a day Active  gabapentin 800 mg oral tablet 1 tab(s) orally 4 times a day Active  atorvastatin 40 mg oral tablet 1 tab(s) orally once a day (at bedtime) Active  Ventolin HFA CFC free 90 mcg/inh inhalation aerosol 2 puff(s) inhaled 4 times a day as needed for shortness of breath/ wheezing.  Active   Lab Results:  Routine Micro:  01-Jun-15 15:33   Micro Text Report BLOOD CULTURE   COMMENT                   NO GROWTH IN 8-12 HOURS   ANTIBIOTIC                       Culture Comment NO GROWTH IN 8-12 HOURS  Result(s) reported on 27 Jun 2013 at 11:00PM.    15:34   Micro Text Report BLOOD CULTURE   COMMENT                   NO GROWTH IN 8-12 HOURS   ANTIBIOTIC                       Culture Comment NO GROWTH IN 8-12 HOURS  Result(s) reported on 27 Jun 2013 at 11:00PM.  Routine Chem:  02-Jun-15 02:42   Glucose, Serum  116  BUN 15  Creatinine (comp) 0.78  Sodium, Serum 141  Potassium, Serum 3.5  Chloride, Serum 106  CO2, Serum 29  Calcium (Total), Serum  8.3  Anion Gap  6  Osmolality (calc) 283  eGFR (African American) >60  eGFR (Non-African American) >60 (eGFR values <6052min/1.73 m2 may be an indication of chronic kidney disease (CKD). Calculated eGFR is useful in patients with stable renal  function. The eGFR calculation will not be reliable in acutely ill patients when serum creatinine is changing rapidly. It is not useful in  patients on  dialysis. The eGFR calculation may not be applicable to patients at the low and high extremes of body sizes, pregnant women, and vegetarians.)  Urine Drugs:  01-Jun-15 18:52   Opiate, Urine qual POSITIVE  Benzodiazepine, Urine Qual. POSITIVE (----------------- The URINE DRUG SCREEN provides only a preliminary, unconfirmed analytical test result and should not be used for non-medical  purposes.  Clinical consideration and professional judgment should be  applied to any positive drug screen result due to possible interfering substances.  A more specific alternate chemical method must be used in order to obtain a confirmed analytical result.  Gas chromatography/mass spectrometry (GC/MS) is the preferred confirmatory method.)  Cardiac:  01-Jun-15 12:41   Troponin I < 0.02 (0.00-0.05 0.05 ng/mL or less: NEGATIVE  Repeat testing in 3-6 hrs  if clinically indicated. >0.05 ng/mL: POTENTIAL  MYOCARDIAL INJURY. Repeat  testing in 3-6 hrs if  clinically indicated. NOTE: An increase or decrease  of 30% or more on serial  testing suggests a  clinically important change)    22:07   Troponin I < 0.02 (0.00-0.05 0.05 ng/mL or less: NEGATIVE  Repeat testing in 3-6 hrs  if clinically indicated. >0.05 ng/mL: POTENTIAL  MYOCARDIAL INJURY. Repeat  testing in 3-6 hrs if  clinically indicated. NOTE: An increase or decrease  of 30% or more on serial  testing suggests a  clinically important change)  02-Jun-15 02:45   Troponin I < 0.02 (0.00-0.05 0.05 ng/mL or less: NEGATIVE  Repeat testing in 3-6 hrs  if clinically indicated. >0.05 ng/mL: POTENTIAL  MYOCARDIAL INJURY. Repeat  testing in 3-6 hrs if  clinically indicated. NOTE: An increase or decrease  of 30% or more on serial  testing suggests a  clinically important change)  Routine  Hem:  02-Jun-15 02:42   WBC (CBC) 6.3  RBC (CBC)  4.10  Hemoglobin (CBC)  12.3  Hematocrit (CBC)  36.4  Platelet Count (CBC)  146  MCV 89  MCH 30.0  MCHC 33.7  RDW  14.8  Neutrophil % 63.2  Lymphocyte % 20.5  Monocyte % 12.2  Eosinophil % 3.4  Basophil % 0.7  Neutrophil # 4.0  Lymphocyte # 1.3  Monocyte # 0.8  Eosinophil # 0.2  Basophil # 0.0 (Result(s) reported on 28 Jun 2013 at 03:00AM.)   EKG:  EKG Interp. by me   Interpretation EKG shows rsr, 86, no acute st/t changes.   Radiology Results: XRay:    01-Jun-15 15:44, Chest Portable Single View  Chest Portable Single View   REASON FOR EXAM:    Central Line Placement  COMMENTS:       PROCEDURE: DXR - DXR PORTABLE CHEST SINGLE VIEW  - Jun 27 2013  3:44PM     CLINICAL DATA:  Central catheter placement    EXAM:  PORTABLE CHEST - 1 VIEW    COMPARISON:  Chest radiograph and chest CT June 27, 2013    FINDINGS:  Central catheter tip is in the superior vena cava. There is no  apparent pneumothorax. There is no edema or consolidation. Heart is  upper normal in size with normal pulmonary vascularity. No  adenopathy.     IMPRESSION:  No edema or consolidation. Central catheter tip in superior vena  cava. No appreciable pneumothorax.      Electronically Signed    By: Lowella Grip M.D.    On: 06/27/2013 15:47         Verified By: Leafy Kindle. WOODRUFF, M.D.,    No Known Allergies:  Vital Signs/Nurse's Notes: **Vital Signs.:   02-Jun-15 09:06  Vital Signs Type Routine  Pulse Pulse 96  Respirations Respirations 19  Systolic BP Systolic BP 262  Diastolic BP (mmHg) Diastolic BP (mmHg) 79  Mean BP 109  BP Source  if not from Vital Sign Device non-invasive  Pulse Ox % Pulse Ox % 97  Pulse Ox Activity Level  At rest  Oxygen Delivery Room Air/ 21 %  Pulse Ox Heart Rate 96  *Intake and Output.:   Daily 02-Jun-15 07:00  Grand Totals Intake:  361.4 Output:  850    Net:  -488.6 24 Hr.:  -488.6  IV  (Primary)      In:  236.4  IV (Primary)      In:  125  Urine ml     Out:  850  Length of Stay Totals Intake:  361.4 Output:  850    Net:  -488.6    Impression 1.  Hypotension/? syncope:   2 day h/o intermittent dizziness with a prolonged period of loss of consciousness and was found to be hypotensive in the ED.  He has had multiple ER visits/admissions for this.   --No events on tele.  ECG non-acute.  Trops nl.   --UDS + for opiates (on percocet @ home).  Could contribute to low pressures? --He denies overdosing his prednisone or antihypertensives.  --Antihypertensives currently on hold.  Will resume coreg @ a decreased dose of 6.67m bid.  Ambulate and see how he does after receiving coreg to assess for recurrent hypotension.  If none, resume enalapril at d/c. Could double the dose if BP running high --Saline lock IVF. --Ambulate and consider d/c after coreg. Suggested to him that when he is lightheaded, that he hold his enalapril/coreg, drink fluids and check BP at home. Resume BP meds once BP is higher.  2.  CAD:    brief, fleeting c/p in setting of Ss yesterday.  No recurrence.   --CE negative.  No acute ECG changes.   --He has known severe RCA dzs with L->R collats on cath in 11/2011.   --Changes to bb/acei as above.   --Cont asa and statin.   --No further inpt w/u.  3.  Chronic diastolic CHF:   Volume stable.  No recent dyspnea or edema.  Currently lying flat. --Adjusting coreg 2/2 frequent bouts of hypotension - coreg may be the culprit. --He does weigh himself @ home and has noted a 30-40 lb wt gain but believes this is more likely secodary to inactivity then fluid as he has had no Sx/Ss of CHF.  4.  Tob Abuse:   complete cessation advised.  5.  HTN:   Med changes as above.  Appears to have a narrow normotensive window.  6.  HL:   on statin.  7.  DJD/Chronic back pain:   ? role of narcotics in hypotension.   Electronic Signatures: BRogelia Mire(NP)   (Signed 02-Jun-15 10:25)  Authored: General Aspect/Present Illness, History and Physical Exam, Review of System, Family & Social History, Past Medical History, Home Medications, Labs, EKG , Radiology, Allergies, Vital Signs/Nurse's Notes, Impression/Plan GIda Rogue(MD)  (Signed 02-Jun-15 13:28)  Authored: General Aspect/Present Illness, History and Physical Exam, Review of System, Family & Social History, Past Medical History, Home Medications, Labs, EKG , Vital Signs/Nurse's Notes, Impression/Plan  Co-Signer: General Aspect/Present Illness, History and Physical Exam, Review of System, Family & Social History, Past Medical History, Home Medications, Labs, EKG , Radiology, Allergies, Vital Signs/Nurse's Notes,  Impression/Plan   Last Updated: 02-Jun-15 13:28 by Ida Rogue (MD)

## 2014-05-20 NOTE — H&P (Signed)
PATIENT NAME:  Walter [name], Aadit E MR#:  [id number] DATE OF BIRTH:  [birth year]  DATE OF ADMISSION:  06/27/2013  PRIMARY CARE PHYSICIAN: Bluford MainSheikh Tejan-Sie, MD  REQUESTING PHYSICIAN: Lowella FairyJohn Woodruff, MD  CHIEF COMPLAINT: Dizziness.   HISTORY OF PRESENT ILLNESS: The patient is a 59 year old male with a known history of chronic low back pain, COPD, and hypertension who is being admitted for hypotension. The patient has been feeling dizzy since last week and has been getting worse. He went to visit his friend's house but was feeling very dizzy throughout his trip in the car. This morning when he woke up he started having chest pain, more on the right side, and decided to come to the Emergency Department. While in the ED, his pressure has been running low from low 70s to high 80s and was feeling lethargic. The patient denies any other symptoms at this time and is being admitted for further evaluation and management.   PAST MEDICAL HISTORY: 1.  Hypertension.  2.  COPD.  3.  Chronic low back pain with DJD.  4.  Kidney stones.  5.  Coronary artery disease.  6.  Hyperlipidemia.  7.  GERD.  PAST SURGICAL HISTORY:  1.  L4-L5 laminectomy.  2.  Nasal surgery. 3.  Knee surgery. 4.  Carpal tunnel surgery.  5.  Kidney stone removal. 6.  Rotator cuff surgery.  ALLERGIES: IBUPROFEN AND TRAMADOL.   SOCIAL HISTORY: Former smoker. Occasional alcohol. He does admit to using cocaine in the past but nothing for awhile.   FAMILY HISTORY: Positive for MI with mother. She also had 3 heart attacks. Brother had MI at age of 58. Mother with kidney cancer. Sister has breast cancer.   MEDICATIONS AT HOME: 1.  Xanax 1 mg p.o. b.i.d.  2.  Ventolin 2 puffs inhaled 4 times a day.  3.  Symbicort 2 puffs inhaled twice a day.  4.  Percocet 10/325 mg 2 tablets p.o. 3 times a day. 5.  Gabapentin 800 mg p.o. 4 times a day. 6.  Enalapril 5 mg p.o. daily.  7.  Coreg 12.5 mg p.o. b.i.d.  8.  Lipitor 40 mg p.o. at bedtime.   9.  Aspirin 81 mg p.o. daily.  10.  Advair 250/50 one puff b.i.d.   REVIEW OF SYSTEMS:  CONSTITUTIONAL: No fever, fatigue, weakness. Feeling dizzy. EYES: No blurred or double vision.  ENT: No tinnitus, ear pain.  RESPIRATORY: No cough, wheeze or hemoptysis.  CARDIOVASCULAR: Positive for chest pain. No orthopnea. No edema. GASTROINTESTINAL: No nausea, vomiting, diarrhea. GENITOURINARY: No dysuria or hematuria.  ENDOCRINE: No polyuria, nocturia. HEMATOLOGIC: No anemia or easy bruising. SKIN: No rash or lesion.  MUSCULOSKELETAL: Chronic back pain.  NEUROLOGIC: No tingling, numbness, weakness. Positive for dizziness.  PSYCHIATRY: No history of anxiety or depression.   PHYSICAL EXAMINATION: VITAL SIGNS: Temperature 98.2, heart rate 83 per minute, respirations 20 per minute, blood pressure 71/60 mmHg, and he is saturating 94% on room air. GENERAL: The patient is a 59 year old male lying in the bed comfortably without any acute distress. EYES: Pupils equal, round, and reactive to light and accommodation. No scleral icterus. Extraocular muscles intact.  HENT: Head atraumatic, normocephalic. Oropharynx and nasopharynx clear.  NECK: Supple. No jugular venous distention. No thyroid enlargement or tenderness.  LUNGS: Clear to auscultation bilaterally. No wheezing, rales, rhonchi, or crepitation.  HEART: S1 and S2 normal. No murmurs, rubs or gallops.  ABDOMEN: Soft, nontender, nondistended. Bowel sounds present. No organomegaly or mass.  EXTREMITIES: No pedal edema,  cyanosis or clubbing.  NEUROLOGIC: Cranial nerves III through XII intact. Muscle strength 5/5 in all extremities. Sensation intact.  PSYCHIATRIC: The patient is alert and oriented x3.  SKIN: No obvious rash, lesion.   DIAGNOSTIC DATA: Normal BMP. Normal CBC. Normal first set of troponin.   CT scan of the chest in the ED showed cardiomegaly, coronary artery disease. No PE. Mild atelectasis.   Chest x-ray in the ED showed no acute  cardiopulmonary disease.   EKG shows normal sinus rhythm.   IMPRESSION AND PLAN: 1.  Hypotension/shock, likely cardiogenic. He does not show any signs of infection at this time, although we will go ahead and get 2 sets of blood cultures, obtain cardiology consultation, and hold all of his blood pressure medication. The patient is started on dopamine drip at this time for severe hypotension/cardiogenic shock. 2.  Chronic obstructive pulmonary disease. Will continue Advair. Monitor his breathing. He seems stable at this time.  3.  Chronic pain. We will hold off his pain medication considering hypotension at this time and consider later once the blood pressure improves. 4. Neuropathy: on gabapentin 5. History of myocardial infarction. Will keep him on aspirin, get serial troponins and obtain 2-D echocardiogram.  TOTAL TIME TAKING CARE OF PATIENT (CRITICAL CARE): 55 minutes. ____________________________ Ellamae Sia. Sherryll Burger, MD vss:sb D: 06/27/2013 16:37:38 ET T: 06/27/2013 17:09:39 ET JOB#: [id number]  cc: Altha Sweitzer S. Sherryll Burger, MD, <Dictator> Sheikh A. Ellsworth Lennox, MD Patricia Pesa MD ELECTRONICALLY SIGNED 06/30/2013 9:54

## 2014-05-20 NOTE — H&P (Signed)
PATIENT NAME:  CARRIE, SCHOONMAKER MR#:  [id number] DATE OF BIRTH:  1955/12/20  DATE OF ADMISSION:  07/09/2013  PRIMARY CARE PHYSICIAN: Chelsa B. Marcelle Overlie, NP   REFERRING PHYSICIAN: Humberto Leep. Margarita Grizzle, MD   CHIEF COMPLAINT: Dizziness, low blood pressure.   HISTORY OF PRESENT ILLNESS: Mr. Tout is a 59 year old male with history of coronary artery disease, nonobstructive, per heart catheterization, continued tobacco use, chronic back pain on multiple narcotic medications, hyperlipidemia. Had a recent admission on 06/27/2013 for hypotension. The patient had work-up done with a CTA of the chest, echocardiogram ruling out for cardiogenic shock, with cardiac enzymes, which came back to be negative. Blood cultures were negative. Even though the patient was initially treated with antibiotics, the antibiotics were discontinued. The patient was initiated on dopamine. By the following day, the patient's blood pressure normalized. Blood pressure medications were held at the time, but started back at the time of discharge. The patient was evaluated by cardiology, who felt this was not related to any cardiogenic shock. Cortisol level was obtained which showed low-normal level of 3.5. The patient was discharged home in stable condition. The patient was doing well until this afternoon, started to experience dizziness, and concerning this, came to the Emergency Department. Work-up in the Emergency Department, the patient was found to have a systolic blood pressure in the 60s. The patient received 2 liters of IV fluids without much improvement. The patient was started on Levophed. The patient states that he was taking multiple pain medications. A central line was placed in the right subclavian area. Throughout the procedure, the patient did not require any sedative medications, was snoring without any sedative medications. Initial set of cardiac enzymes are negative. The patient is found to have fever of 99.3, tachycardia  with a heart rate of 100. The patient is also found to have acute renal failure. At baseline, the patient has a normal creatinine. The patient denies having any chest pain. Denies having any cough, shortness of breath, denies having any nausea, vomiting and diarrhea.   PAST MEDICAL HISTORY: 1. Coronary artery disease, nonobstructive disease, on medical management.  2. Diastolic congestive heart failure. The patient has ejection fraction of 50% to 55%.  3. Obesity.  4. Chronic back pain.  5. Peripheral neuropathy.  6. Hyperlipidemia.  7. History of a myocardial infarction.  8. Chronic obstructive pulmonary disease.  9. Hypertension.  10. History of kidney stones.  11. L4-L5 fusion.  12. Nasal surgery. 13. Left knee surgery  14. Right wrist surgery x4.  15. Carpal tunnel surgery.  16. Rotator cuff tear surgery.   ALLERGIES: No known drug allergies.   HOME MEDICATIONS: 1. Xanax XR 1 mg 2 times a day.  2. Ventolin 2 puffs 4 times a day.  3. Percocet 10/325 mg 2 tablets take every three hours. 4. Gabapentin 800 mg 4 times a day.  5. Coreg 12.5 mg 2 times a day.  6. Atorvastatin 40 mg once a day.  7. Aspirin 81 mg once a day.  8. Advair Diskus 250/50 at 1 puff 2 times a day.   SOCIAL HISTORY: Continues to smoke 1 pack a day; denies drinking alcohol or using any illicit drugs. Lives by himself.    FAMILY HISTORY: Cancer runs in the family. Both brother and sister had lung cancer and brain cancer. Mother had a heart attack and cancer. Also hypertension and diabetes mellitus run in the family members.   REVIEW OF SYSTEMS: CONSTITUTIONAL: Experiencing generalized weakness.  EYES: No change  in vision.  ENT: No change in hearing.  RESPIRATORY: Has a mild cough. No shortness of breath.  CARDIOVASCULAR: No chest pain, palpations.  GASTROINTESTINAL: No nausea, vomiting, abdominal pain.  GENITOURINARY: No dysuria or hematuria.  HEMATOLOGY: No easy bruising or bleeding.  ENDOCRINE: No  polyuria or polydipsia.  MUSCULOSKELETAL: Complains of chronic back pain.  SKIN: No rash or lesions.  NEUROLOGIC: No weakness or numbness in any part of the body.   PHYSICAL EXAMINATION:  GENERAL: This is a well-built, well-nourished, obese male lying down in the bed, quite somnolent; quickly falling asleep; however, arousable. Complaints of back pain, not in distress.  VITAL SIGNS: Temperature 99.3, pulse 100, blood pressure 73/52, respiratory rate of 16, oxygen saturation is 90% on 2 liters of oxygen.  HEENT: Head normocephalic, atraumatic. No scleral icterus. Conjunctivae normal. Pupils equal and react to light. Extraocular movements are intact. Mucous membranes moist. No pharyngeal erythema.  NECK: Supple. No lymphadenopathy. No JVD. No carotid bruit.  CHEST: No focal tenderness. LUNGS:  Bilaterally clear to auscultation.  HEART: S1, S2 regular. No murmurs are heard.  ABDOMEN: Bowel sounds present. Soft, nontender, nondistended. No hepatosplenomegaly.  EXTREMITIES: No pedal edema. Pulses 2+.  SKIN: No rashes or lesions.  MUSCULOSKELETAL: Good range of motion in all the extremities. NEUROLOGIC: Patient is somnolent; however, arousable. Oriented to place, person, and time. No apparent cranial nerve abnormalities. Motor 5/5 in upper and lower extremities.   LABORATORY DATA: Complete metabolic panel: BUN 31, creatinine of 4.54. The rest of the values are within normal limits. CBC: WBC of 16.4, hemoglobin 12, platelet count of 203,000. CK 559, CK-MB of 16.4, BNP 616, troponin less than 0.02.   Chest x-ray, 1 view, portable: No evidence of cardiopulmonary disease.   EKG, 12-lead: Normal sinus rhythm with no ST-T wave abnormalities.   ASSESSMENT AND PLAN: [name] is a 59 year old male who comes with hypotension.  1. Hypotension. Concerned about if the patient has been over-medicating himself including pain medications (Dictation Anomaly), blood pressure medications; however, during the  previous visit, the patient had low-normal cortisol level of 3.5, unlikely this is cardiogenic. Patient had a work-up done. Initial set of cardiac enzymes are negative.  2. Possibility of infection. The patient has elevated white blood cell count. Will obtain a urinalysis. The patient was given vancomycin and Zosyn in the Emergency Department after obtaining blood cultures. Admit the patient to the Intensive Care Unit. Continue to cycle cardiac enzymes x3. Continue with antibiotics until blood cultures come back to be negative. Hold all blood pressure medications. The patient is insisting that he should be given the pain medications; otherwise, he has been medical advice. Continue with Levophed.  3. Acute renal failure secondary to acute tubular necrosis from hypotension distress. Restore the blood pressure and continue with intravenous fluids and follow up.  4. Chronic back pain. The patient has been taking frequent multiple doses of pain medications, which is keeping him at a high risk of life-threatening overdoses. Discussed with the patient; however seems to have an extremely poor insight. Will watch closely about the patient's mental status and follow up.  5. History of coronary artery disease. Does not seem to be a current issue. Continue with home medications of aspirin, Plavix and statin.  6. Adrenal insufficiency. The patient has a low-normal cortisol level despite having low blood pressure. Will also check the ACTH levels. If his ACTH levels are low, will need to do further workup. 7. (Dictation Anomaly) disease x1. The patient does not  have any neurologic deficits at this time.  8. Keep the patient on deep vein thrombosis prophylaxis with Lovenox. We did also add about that. Leukocytosis. Will obtain urinalysis. Blood cultures have been obtained. De-escalate the antibiotics once culture data is available.   TIME SPENT: 60 minutes of critical care time. copy Dr. Caryn Bee is a [facility].     ____________________________ Susa Griffins, MD pv:lm D: 07/09/2013 21:55:23 ET T: 07/10/2013 00:38:25 ET JOB#: [id number]  cc: Susa Griffins, MD, <Dictator> Chelsa B. Marcelle Overlie, NP Clerance Lav Perline Awe MD ELECTRONICALLY SIGNED 07/13/2013 7:23

## 2014-05-20 NOTE — Consult Note (Signed)
PATIENT NAME:  Walter [name], Walter [name] MR#:  [id number] DATE OF BIRTH:  08-08-1955  DATE OF CONSULTATION:  07/10/2013  REFERRING PHYSICIAN:   CONSULTING PHYSICIAN:  Claude Manges, MD  REASON FOR CONSULTATION:  Abdominal pain and possible mild adynamic ileus.   HISTORY OF PRESENT ILLNESS: The patient is a 59 year old white male who was readmitted to the hospital yesterday for the 2nd time in 2 weeks with hypotension of unknown, but noncardiogenic, etiology. He was treated briefly with Levophed and IV fluid resuscitation during this admission as he was noted to have acute renal failure with a creatinine of 4.5. There is some question of adrenal insufficiency, and now that the patient is more stable and is on the floor, he is complaining of abdominal pain. He states that he has had constant abdominal pain for the last 3 days and he points to the general area of his right upper quadrant underneath his rib cage on the right side.  This abdominal pain is not associated with nausea or vomiting and it is constant. The patient denies fever and chills and states that he typically has a bowel movement every day. He says he has had 1 bowel movement since his hospital admission which was approximately 24 hours ago. He also says that he continues to pass flatus. The patient underwent a 3-way abdominal x-ray (including chest x-ray) today and it was read as mild adynamic ileus. The patient's white blood cell count has gone from 16,000 to 13,000 on IV antibiotics, but he is also receiving IV steroids for his possible adrenal insufficiency. He has a normal troponin, normal liver function tests and normal lipase.   PAST MEDICAL HISTORY:  1. History of myocardial infarction with nonobstructive coronary artery disease (according to cardiac catheterization) and an ejection fraction of 50%.  2. Chronic obstructive pulmonary disease with continued tobacco abuse.  3. Dyslipidemia.  4. Hypertension.  5. Peripheral  neuropathy.  6. Obesity.  7. Chronic low back pain.  8. Acute renal failure, resolving.   MEDICATIONS AT HOME: Xanax XR 1 mg b.i.d., Ventolin 2 puffs q.i.d., Percocet 10/325 two tablets q.3 hours, gabapentin 800 mg q.i.d., Coreg 12.5 mg b.i.d., atorvastatin 40 mg daily, aspirin 81 mg daily, Advair Diskus 250/50 one puff b.i.d.   ALLERGIES: None.   SOCIAL HISTORY: The patient does not drink alcohol or use any illicit drugs. He lives by himself and he smokes 1 pack of cigarettes per day.   FAMILY HISTORY: Noncontributory.   REVIEW OF SYSTEMS: Positive for a mild cough and chronic low back pain in addition to his abdominal pain as seen in the history of present illness.   PHYSICAL EXAMINATION:  GENERAL: Reveals a healthy middle-aged, obese male lying comfortably in the hospital bed. Height is 70 inches, weight 246 pounds.  VITAL SIGNS: Temperature 100.4, pulse 97, blood pressure 107/70, oxygen saturation 97% on 3-liter nasal cannula at rest. Urine output was 400 mL up until 7 a.m. and 1000 mL in the first 16 hours of today.  HEENT: Pupils equally round and reactive to light. Extraocular movements intact. Sclerae nonicteric. Oropharynx clear. Mucous membranes moist. Hearing intact to voice.  NECK: Supple with no thyroid enlargement, tracheal deviation or jugular venous distention.  HEART: Regular rate and rhythm with no murmurs or rubs.  LUNGS: Clear to auscultation with normal respiratory effort bilaterally.  ABDOMEN: Obese, nondistended, nonprotuberant, and with no tympany. Although he has mild generalized abdominal tenderness to examination, he has no evidence of peritoneal signs such as rebound,  tenderness or guarding.  EXTREMITIES: No edema with normal capillary refill bilaterally.  NEUROLOGIC: Cranial nerves II through XII, motor, and sensation grossly intact.  PSYCHIATRIC: Alert and oriented x 4. Appropriate affect.   OTHER STUDIES: The 3-way abdominal film today was reviewed by myself  and showed liquid stool in the cecum, solid stool and gas in a nondilated transverse colon, gas in the left colon and rectum, and scattered areas of gas in the small intestine.   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 Perhaps the abdominal pain is related to mild opioid withdrawal symptoms since now his supervised does of oxycodone is less than half than what he took at home. The patient states that  it has been years since his gallbladder has been evaluated and gallstones are a possibility as well.   PLAN: Right upper quadrant ultrasound. We will continue to follow.   ____________________________ Claude MangesWilliam F. Shacora Zynda, MD wfm:dd D: 07/10/2013 19:57:01 ET T: 07/10/2013 20:21:28 ET JOB#: [id number]  cc: Claude MangesWilliam F. Judianne Seiple, MD, <Dictator> Claude MangesWILLIAM F Devonne Lalani MD ELECTRONICALLY SIGNED 07/11/2013 4:37

## 2014-05-20 NOTE — Consult Note (Signed)
PATIENT NAME:  Walter [name], Walter [name] MR#:  [id number] DATE OF BIRTH:  1955-06-05  DATE OF CONSULTATION:  07/11/2013  REFERRING PHYSICIAN:  Srikar R. Sudini, MD CONSULTING PHYSICIAN:  A. Wendall Mola, MD  CHIEF COMPLAINT: Low blood pressure and low cortisol.   HISTORY OF PRESENT ILLNESS: This is a 59 year old male with a history of chronic obstructive pulmonary disease and coronary artery disease, admitted recently with symptomatic hypotension, who re-presented on June 13th with abdominal pain. He was noted to have acute renal failure with a creatinine of 4.5. He was hypotensive with systolic blood pressure initially in the 60 to 80 range. White blood cell count was increased at 16,000. A CT scan of the abdomen revealed complex appendicitis with probable perforation. He was initiated on IV antibiotics and general surgery was consulted Dr. Anda Kraft, general surgery, performed an appendectomy earlier today. The patient is still with some abdominal discomfort but states it is tolerable and improved from prior.   During his prior hospitalization, he had had a cortisol drawn in the setting of hypotension. The cortisol level was obtained around 2:30 in the morning and it was 3.5. The patient denies any known history of adrenal insufficiency. Denies any prior adrenal or renal trauma. Yesterday, he did receive a dose of hydrocortisone 50 mg one time at 5:30 p.m.  He denies any known recent exposure to oral glucocorticoids.   PAST MEDICAL HISTORY: 1.  Coronary artery disease, history of acute myocardial infarction.  2.  Tobacco dependence.  3.  Obesity.  4.  Fatty liver disease on CT imaging.  5.  Chronic low back pain.  6.  History of diastolic congestive heart failure.  7.  Peripheral neuropathy.  8.  Hyperlipidemia.  9.  Hypertension.  10.  History of kidney stones.   PAST SURGICAL HISTORY: 1.  L4-L5 fusion.  2.  Nasal surgery.  3.  Left knee surgery.  4.  Right wrist surgery x 4.  5.  Carpal  tunnel surgery.  6.  Rotator cuff tear repair.   ALLERGIES: No known allergies.   CURRENT MEDICATIONS: 1.  Gabapentin 600 mg q.i.d.  2.  Alprazolam 1 mg b.i.d.  3.  Ceftriaxone 1 gram daily.  4.  Cleocin 600 mg q.8 hours.  5.  Pepcid 20 mg daily.  6.  Lovenox 40 mg daily.  7.  Albuterol inhaler 2 puffs q.4 hours while awake.  8.  Advair Diskus 1 puff b.i.d.   FAMILY HISTORY: Reports a brother had lung cancer. Mother had kidney cancer. Also positive for heart disease, hypertension and diabetes.   SOCIAL HISTORY: The patient smoked 1 pack per day prior to admission, expresses intent to quit smoking. Denies use of alcohol. Lives alone.   REVIEW OF SYSTEMS:    GENERAL: No weight loss. No fever.  HEENT: Denies blurred vision. Denies sore throat.  NECK: Denies neck pain or dysphagia.  CARDIAC: Denies chest pain or palpitation.  PULMONARY: He does report a nonproductive cough. Denies wheeze, denies dyspnea at rest.  ABDOMEN: Has abdominal tenderness. Reports appetite is good.  EXTREMITIES: Denies leg swelling.  SKIN: Denies recent rash or skin changes.  ENDOCRINE: Denies heat or cold intolerance.  GENITOURINARY: Denies dysuria or hematuria.   PHYSICAL EXAMINATION: VITAL SIGNS: Height 70 inches, temperature 97.6, pulse 94, respirations 18, blood pressure 117/77.  GENERAL: White male in no acute distress.  HEENT: Extraocular movements are intact. Oropharynx is clear.  NECK: Supple. No thyromegaly.  CARDIAC: Regular rate and rhythm. No audible murmur.  PULMONARY: Clear to auscultation bilaterally. No wheeze.  ABDOMEN: Diffuse tenderness.  EXTREMITIES: No peripheral edema is present.  NEUROLOGIC: No tremor of outstretched hands. No dysarthria.  SKIN: No rash or recent skin changes. No hyperpigmentation of palmar creases.   LABORATORY DATA: Glucose 116, BUN 13, creatinine 0.64, sodium 136, potassium 4.3, CO2 27, calcium 7.6. CK 651, CK-MB 14.2. Troponin I 0.02. WBC 13.2, hematocrit  31.9, platelets 121. Blood culture gram-positive cocci in aerobic and anaerobic, 1 out of 2.   ASSESSMENT: A 59 year old male with acute appendicitis, status post appendectomy, previously having hypotension at this admission as well as a prior admission about 2 weeks ago and in setting of a low overnight cortisol. Certainly several reasons to have had hypotension including the appendicitis, use of narcotics and dehydration (as evidenced by elevated creatinine at admission). Low cortisol warrants further screening for adrenal insufficiency.   RECOMMENDATIONS:  1.  Will perform a cosyntropin stimulation test. Plan to obtain a baseline cortisol, give cosyntropin 250 mcg and then repeat a cortisol after about 60 minutes.  2.  A normal stimulated cortisol would be greater than 18.  3.  Hold off on giving any steroids unless clinically necessary until the cosyntropin stim test is completed.  4.  If stimulated cortisol is less than 18, then would consider giving low-dose cortisol for a time before tapering off and retesting. I will follow up with the results once available.  5.  The patient was counseled to quit smoking.   ____________________________ A. Wendall MolaMelissa Arpita Fentress, MD ams:cs D: 07/11/2013 16:45:00 ET T: 07/11/2013 18:06:01 ET JOB#: [id number]  cc: A. Wendall MolaMelissa Niels Cranshaw, MD, <Dictator> Macy MisA. MELISSA Rahim Astorga MD ELECTRONICALLY SIGNED 07/27/2013 17:25

## 2014-05-20 NOTE — Consult Note (Signed)
PATIENT NAME:  Walter [name], Walter [name] MR#:  [id number] DATE OF BIRTH:  [birth year]  DATE OF CONSULTATION:  08/01/2013.  CONSULTING PHYSICIAN:  Yaritza Leist A. Jaanai Salemi, MD  REASONS FOR CONSULTATION: Abdominal pain, respiratory failure, small fluid collection noted on CT scan below the tip of the liver in the perinephric space.   HISTORY OF PRESENT ILLNESS: Mr. Walter [name] is a 59 year old man well-known to me, who had a laparoscopic appendectomy and drainage of a subphrenic abscess on 07/11/2013. He was discharged later on 07/14/2013. I had seen him in the office and as he was clinically improving I removed his drain approximately 1 week ago. He returns with right lower quadrant abdominal pain and a white cell count of 9. He had a CT scan which showed a small area of inferior hepatic and perinephric fat-stranding and possible small fluid collection. He was during his Emergency Room course given Dilaudid and was noted to be unresponsive and hypoxic. He was admitted and intubated and is currently on the ventilator due to patient sedation. Unable to obtain respiratory status; however, I did see the patient 1 week ago and he does have a history of chronic abdominal pain and narcotic use.  He complains of persistent abdominal pain.   PAST MEDICAL HISTORY:  1.  Status post ruptured appendix and perinephric abscess status post appendectomy and drainage of abscess.  2.  History of coronary artery disease.  3.  History of COPD. 4.  History of diastolic congestive heart failure.  5.  History of hyperlipidemia.  6.  History of hypertension.   SOCIAL HISTORY: Plus tobacco use. No alcohol or drug use.   FAMILY HISTORY: Positive for hypertension, diabetes.   ALLERGIES: No known drug allergies.   HOME MEDICATIONS: Percocet, aspirin, enalapril, gabapentin, Zofran, atorvastatin, Xanax, Coreg, Advair, Ventolin, Colace.   REVIEW OF SYSTEMS:  Unable to obtain.   PHYSICAL EXAMINATION:  VITAL SIGNS: Temperature 98.8,  pulse 74, blood pressure 115/82, respirations 22, oxygen saturation 96% on room air.  GENERAL: No acute distress. Alert and oriented x3.  HEAD: Normocephalic, atraumatic.  EYES: No scleral icterus. No conjunctivitis.  FACE: No obvious facial trauma. Normal external nose. Normal external ears.  CHEST: Lungs clear to auscultation. Moving air well.  HEART: Regular rate and rhythm. No murmurs, rubs, or gallops.  ABDOMEN: Soft and nondistended, not tender, but sedated.  EXTREMITIES: Unable to assess status.  NEUROLOGIC: Unable to assess status.   LABORATORY DATA: White cell count 9.7, hemoglobin 11.6, hematocrit 34.7, platelets are 224,000, BNP is negative, unremarkable. LFTs are normal except for albumin of 3.2.   Urinalysis: Negative nitrate, negative leukocyte esterase.   IMAGING: CT scan shows some fat stranding and a possible small abscess in the right perinephric space and along the inferior liver edge.   ASSESSMENT AND PLAN:  Mr. Walter [name] is a pleasant 59 year old male with history of ruptured appendix and subphrenic abscess who came in in respiratory failure, unable to see prior. Not an impressive CT scan, or leukocytosis.  Could be a small abscess and therefore may consider antibiotics but has not had any fever and is normotensive. Would recommend dealing with nonsurgical issues.  No surgical issues at this time.    ____________________________ Si Raiderhristopher A. Macenzie Burford, MD cal:lt D: 08/01/2013 11:32:47 ET T: 08/01/2013 12:21:34 ET JOB#: [id number]  cc: Cristal Deerhristopher A. Louna Rothgeb, MD, <Dictator> Jarvis NewcomerHRISTOPHER A Haniyah Maciolek MD ELECTRONICALLY SIGNED 08/07/2013 18:14

## 2014-05-20 NOTE — H&P (Signed)
PATIENT NAME:  Walter [name], Walter [name] MR#:  [id number] DATE OF BIRTH:  06/21/1955  DATE OF ADMISSION:  11/27/2013  REFERRING PHYSICIAN: Mark R. Quale, MD   FAMILY PHYSICIAN: Dr. Tejan-Sie    REASON FOR ADMISSION: Acute MI.  HISTORY OF PRESENT ILLNESS: The patient is a 58-year-old male with a long-standing history of coronary artery disease and chronic diastolic CHF. Has chronic occlusion of his coronary arteries. Also has a history of COPD, tobacco abuse, chronic pain, and peripheral neuropathy. Presents to the Emergency Room today with 12-16 hour history of progressive chest pain and shortness of breath. Pain is described as a pressure in his chest and is nonradiating. Having some shortness of breath associated with this. No nausea, vomiting, or diaphoresis. Was brought to the Emergency Room where his symptoms were relieved with morphine and nitroglycerin. He continues to have 7/10 chest pain. His EKG did not show any ST elevation, but did show EKG changes. His troponin is elevated at 3.1, consistent with an acute non-ST elevation MI. He is now admitted for further evaluation.   PAST MEDICAL HISTORY: 1.  ASCVD, status post MI.  2.  Chronic coronary occlusion.  3.  Chronic diastolic congestive heart failure.  4.  Chronic obstructive pulmonary disease, tobacco abuse.  5.  Benign hypertension.  6.  Peripheral neuropathy.  7.  Chronic pain.  8.  Hyperlipidemia.  9.  Nephrolithiasis.  10.  Previous back surgery.  11.  History of carpal tunnel surgery.   MEDICATIONS: 1.  Advair 250/50 one puff b.i.d.  2.  ProAir 2 puffs q. 4 hours p.r.n. shortness of breath.  3.  Oxycodone 10 mg p.o. q. 6 hours.  4.  Gabapentin 800 mg p.o. q.i.d.  5.  Enalapril 5 mg p.o. daily.  6.  Coreg 12.5 mg p.o. b.i.d.  7.  Lipitor 40 mg p.o. at bedtime.  8.  Aspirin 81 mg p.o. daily.   ALLERGIES: No known drug allergies.   SOCIAL HISTORY: The patient quit drinking alcohol 2 years ago. Smokes 1 pack per day.    FAMILY HISTORY: Positive for coronary artery disease and stroke, but negative for prostate or colon cancer.    REVIEW OF SYSTEMS:  CONSTITUTIONAL: No fever or change in weight.  EYES: No blurred or double vision. No glaucoma.  EARS, NOSE AND THROAT: No tinnitus or hearing loss. No nasal discharge or bleeding. No difficulty swallowing.  RESPIRATORY: No cough or wheezing. Denies hemoptysis. CARDIOVASCULAR: No orthopnea or syncope.  GASTROINTESTINAL: No nausea, vomiting, or diarrhea. No abdominal pain. No change in bowel habits.  GENITOURINARY: No dysuria or hematuria. No incontinence.  ENDOCRINE: No polyuria or polydipsia. No heat or cold intolerance.  HEMATOLOGIC: The patient denies anemia, easy bruising, or bleeding.  LYMPHATIC: No swollen glands.  MUSCULOSKELETAL: The patient has pain in his back, but denies pain in his neck, shoulders, knees, or hips. No gout.  NEUROLOGIC: No numbness or migraines. Denies stroke or seizures.  PSYCHOLOGICAL: The patient denies anxiety, insomnia, or depression.   PHYSICAL EXAMINATION: GENERAL: The patient is in mild distress.  VITAL SIGNS: Remarkable for a blood pressure of 143/85 with a heart rate of 76, respiratory rate is 18, temperature of 98, saturating 95% on room air.  HEENT: Normocephalic, atraumatic. Pupils equal, round, and reactive to light and accommodation; extraocular movements are intact. Sclerae not icteric. Conjunctivae are clear.  Oropharynx is clear. NECK: Supple without JVD. No adenopathy or thyromegaly is noted.  LUNGS: Clear to auscultation and percussion without wheezes, rales, or   rhonchi. No dullness. Respiratory effort is normal.  CARDIAC: Regular rate and rhythm. Normal S1, S2. No significant rubs, murmurs, or gallops. PMI is nondisplaced. Chest wall is nontender.  ABDOMEN: Soft, nontender, with normoactive bowel sounds. No organomegaly or masses were appreciated. No hernias or bruits were noted.  EXTREMITIES: Without clubbing,  cyanosis, edema. Pulses were 2+ bilaterally.  SKIN: Warm and dry without rash or lesions.  NEUROLOGIC: Revealed cranial nerves II through XII grossly intact. Deep tendon reflexes were symmetric. Motor and sensory exam is nonfocal.  PSYCHIATRIC: Revealed a patient who was alert and oriented to person, place, and time. He was cooperative and used good judgment.   EKG revealed sinus rhythm with lateral ST segment depression. Chest x-ray revealed no acute disease.  LABORATORY DATA:  His white count was 11.7 with a hemoglobin of 14.0. His glucose 107 with a BUN of 11, a creatinine of 0.75 and a GFR of greater than 60. BNP was 4581 with a  CK-MB of 10.9 and a troponin of 3.1.   ASSESSMENT: 1.  Acute non-ST elevation myocardial infarction.  2.  Unstable angina.  3.  Chronic diastolic congestive heart failure. 4.  Benign hypertension.  5.  Chronic obstructive pulmonary disease and tobacco abuse.   PLAN: The patient will be admitted to intensive care unit as a full code with a heparin drip and a nitroglycerin drip. We will follow serial cardiac enzymes and obtain an echocardiogram. Will consult cardiology. Will make the patient n.p.o. after midnight in hopes of cardiac catheterization tomorrow. We will continue his outpatient pain regimen. Follow up routine labs and chest x-ray in the morning. Further treatment and evaluation will depend upon the patient's progress. Total   Time spent on this patient was 50 minutes.   ____________________________ Jalen Oberry D. Shateria Paternostro, MD jds:LT D: 11/27/2013 16:20:41 ET T: 11/27/2013 16:58:34 ET JOB#: [id number]  cc: Yogesh Cominsky D. Stephani Janak, MD, <Dictator> Dr. Dechancy Jaevian Shean D Shloime Keilman MD ELECTRONICALLY SIGNED 11/27/2013 21:36 

## 2014-05-20 NOTE — Consult Note (Signed)
General Aspect [facility] CARDIOLOGY CONSULTAION NOTE  REQUESTING MD: Dr. Doy Hutching  PRIMARY CARDIOLOGIST: Dr. Fletcher Anon  PCP: Bailey Mech Ahmed  REASON FOR CONSULTATION: NSTEMI, H/o CAD & DHF  59 year old male with known single vessel CAD, chronic diastolic heart failure and labile hypertension.?? He underwent diagnostic catheterization in November of 2013 secondary to a non-STEMI following back surgery. He was found to have a subtotal occlusion of the right coronary artery with good left-to-right collaterals and was medically managed. He was treated with Plavix for one year.  Last saw Dr. Fletcher Anon in May 2015.   Present Illness He presented to [facility] ER today for persistent L-sided/substernal CP & profound dyspnea that began last night.  Sx were ~8-9/10 at worst last PM,but started with dyspnea then progressed to a heavy pressure sensation.  Did not radiate.  Sx lasted 10-15 min & waxed-waned all night, but never fully went away.  Would recur with dyspnea followed by pressure with even minimal activity.  Finally came to ED - brought by family member.  + Orthopnea & PND over night. No edema On initial evaluation in ER - troponin level ~3 with lateral ST-depressions & inferior TWI that were new on EKG.   Currently upon my evaluation pain has subsided ~1-2/10 after SL NTG & IV Heparin. He is being admitted to CCU by IM Service -- NTG gtt ordered. Echo in AM.  Cardiac ROS: Prior to last PM - no antecedent CP or DOE or PND, orthopnea.  No palpitations, rapid HR, Syncope /near syncope, TIA/ Amaurosis Fugax Sx.   No claudication.  Past Medical History?? Diagnosis?? Date?? ????? Hypertension?? ?? ????? Degenerative joint disease of spine?? ?? ?? ?? a. 11/2011 s/p L4-L5 fusion.?? ????? Chronic back pain?? ?? ????? Chronic diastolic CHF (congestive heart failure)?? ?? ????? COPD (chronic obstructive pulmonary disease)?? ?? ????? CAD (coronary artery disease)?? ?? ?? ?? a. 11/2011 NSTEMI/Cath:  subtotal occlusion of the mid RCA with L->R collats, nl LV fxn-->medical Rx;?? ????? Nephrolithiasis?? ?? ????? Tobacco abuse?? ?? ?? ?? a. 80+ pack year hx?? ????? Rupture of rotator cuff of shoulder?? ?? ?? ?? left?? ????? Anemia?? ??   ???? Past Surgical History?? Procedure?? Laterality?? Date?? ????? Shoulder surgery?? ?? ?? ????? Knee surgery?? ?? ?? ????? Wrist surgery?? ?? ?? ????? Nose surgery?? ?? ?? ????? Carpal tunnel release?? ?? ?? ????? Kidney stone?? ?? ?? ?? ?? removed?? ????? L4-l5 spine surgery?? ?? 2013?? ????? Cardiac catheterization?? ?? 12/15/2011   SUMMARY:  -HEMODYNAMICS: Hemodynamic assessment demonstrates borderline systemic hypertension and mildly elevated LVEDP.  -CORONARY CIRCULATION: There was severe 1-vessel coronary artery disease. Subtotal occlusion of mid RCA with reasonable left to right collaterals. The vessel is heavily calcified and tortuous. Risks of PCI likely outweigh benefit at this time. Recommend medical therapy.  -CARDIAC STRUCTURES: Global left ventricular function was normal. EF estimated was 60 %.   Physical Exam:  GEN well developed, well nourished, no acute distress, obese   HEENT pink conjunctivae, PERRL, moist oral mucosa, Oropharynx clear, poor dentition, edentulous   NECK supple  masses present  No masses  thyroid not tender  trachea midline  R carotid bruit   RESP normal resp effort  clear BS  no use of accessory muscles   CARD Regular rate and rhythm  Normal, S1, S2  No murmur   ABD denies tenderness  denies Flank Tenderness  no liver/spleen enlargement  soft  no Abdominal Bruits   LYMPH negative neck   EXTR negative cyanosis/clubbing, negative edema  SKIN normal to palpation, skin turgor good   NEURO cranial nerves intact, negative rigidity   PSYCH A+O to time, place, person, good insight   Review of Systems:  Subjective/Chief Complaint Chest Pain & SOB   General: No Complaints   Eyes: No Complaints    Neck: No Complaints   Respiratory: Short of breath  Starting last night   Cardiovascular: Chest pain or discomfort  Tightness   Gastrointestinal: No Complaints  No melena, hematochezia   Vascular: No Complaints   Musculoskeletal: No Complaints   Hematologic: takes Iron supplement, no bleeding or bruising   Review of Systems: All other systems were reviewed and found to be negative   Medications/Allergies Reviewed Medications/Allergies reviewed   Family & Social History:  Family and Social History:  Family History Coronary Artery Disease  Hypertension  Smoking  Father & Brother with CAD, HTN, HLD   Social History positive  tobacco, positive tobacco (Greater than 1 year), positive ETOH, negative Illicit drugs, Legally separated   + Tobacco Current (within 1 year)  80 pk yr   Place of Living Home     tobacco abuse:    CAD: a. 11/2011 NSTEMI/Cath: LM min irregs, LAD 30p/m, LCX 76m RCA 40p/962mEF 60%--->Med Rx   chronic diastolic chf: a. 1132/6712cho: EF 50-55%, septal DK, mod apical HK, ? lat HK.   obesity:    Hypercholesterolemia:    Myocardial Infarct:    copd:    Hypertension:    chronic back pain:    riight wrist surgery 4 times with steel plate:    carpal tunnel:   Home Medications: Medication Instructions Status  aspirin 81 mg oral tablet, chewable 1 tab(s) orally once a day Active  carvedilol 12.5 mg oral tablet 1 tab(s) orally 2 times a day Active  enalapril 5 mg oral tablet 1 tab(s) orally once a day Active  gabapentin 800 mg oral tablet 1 tab(s) orally 4 times a day Active  atorvastatin 40 mg oral tablet 1 tab(s) orally once a day (at bedtime) Active  Advair Diskus 250 mcg-50 mcg inhalation powder 1 puff(s) inhaled 2 times a day Active  oxyCODONE 10 mg oral tablet 1 tab(s) orally 6 times a day Active  ProAir HFA CFC free 90 mcg/inh inhalation aerosol 2 puff(s) inhaled 4 times a day, As Needed - for Shortness of Breath, for Wheezing  Active   Symbicort 80 mcg-4.5 mcg/inh inhalation aerosol 2 puff(s) inhaled 2 times a day Active   Lab Results: Routine Chem:  01-Nov-15 14:13   Result Comment TROPONIN - RESULTS VERIFIED BY REPEAT TESTING.  - CALLED TO JULIE REITZ @ [id number] 11/27/13  - CAF  - READ-BACK PROCESS PERFORMED.  Result(s) reported on 27 Nov 2013 at 03:02PM.  Glucose, Serum  107  BUN 11  Creatinine (comp) 0.75  Sodium, Serum 142  Potassium, Serum 4.0  Chloride, Serum 106  CO2, Serum 31  Calcium (Total), Serum 8.8  Anion Gap  5  Osmolality (calc) 283  eGFR (African American) >60  eGFR (Non-African American) >60 (eGFR values <6085min/1.73 m2 may be an indication of chronic kidney disease (CKD). Calculated eGFR, using the MRDR Study equation, is useful in  patients with stable renal function. The eGFR calculation will not be reliable in acutely ill patients when serum creatinine is changing rapidly. It is not useful in patients on dialysis. The eGFR calculation may not be applicable to patients at the low and high extremes of body sizes, pregnant women, and vegetarians.)  B-Type Natriuretic Peptide [facility])  4581 (Result(s) reported on 27 Nov 2013 at 02:52PM.)  Cardiac:  01-Nov-15 14:13   CPK-MB, Serum  10.9 (Result(s) reported on 27 Nov 2013 at 03:30PM.)  Troponin I  3.10 (0.00-0.05 0.05 ng/mL or less: NEGATIVE  Repeat testing in 3-6 hrs  if clinically indicated. >0.05 ng/mL: POTENTIAL  MYOCARDIAL INJURY. Repeat  testing in 3-6 hrs if  clinically indicated. NOTE: An increase or decrease  of 30% or more on serial  testing suggests a  clinically important change)  Routine Coag:  01-Nov-15 14:13   Activated PTT (APTT) 33.2 (A HCT value >55% may artifactually increase the APTT. In one study, the increase was an average of 19%. Reference: "Effect on Routine and Special Coagulation Testing Values of Citrate Anticoagulant Adjustment in Patients with High HCT Values." American Journal of Clinical Pathology  2006;126:400-405.)  Prothrombin 13.4  INR 1.0 (INR reference interval applies to patients on anticoagulant therapy. A single INR therapeutic range for coumarins is not optimal for all indications; however, the suggested range for most indications is 2.0 - 3.0. Exceptions to the INR Reference Range may include: Prosthetic heart valves, acute myocardial infarction, prevention of myocardial infarction, and combinations of aspirin and anticoagulant. The need for a higher or lower target INR must be assessed individually. Reference: The Pharmacology and Management of the Vitamin K  antagonists: the seventh ACCP Conference on Antithrombotic and Thrombolytic Therapy. LJQGB.2010 Sept:126 (3suppl): N9146842. A HCT value >55% may artifactually increase the PT.  In one study,  the increase was an average of 25%. Reference:  "Effect on Routine and Special Coagulation Testing Values of Citrate Anticoagulant Adjustment in Patients with High HCT Values." American Journal of Clinical Pathology 2006;126:400-405.)  Routine Hem:  01-Nov-15 14:13   WBC (CBC)  11.7  RBC (CBC) 4.82  Hemoglobin (CBC) 14.0  Hematocrit (CBC) 42.1  Platelet Count (CBC) 191 (Result(s) reported on 27 Nov 2013 at 02:23PM.)  MCV 87  MCH 29.1  MCHC 33.2  RDW  15.9   EKG:  EKG Interp. by me  NSR  nml intervals  nml axis   Abnormal T inversion  Inferior TWI, Lateral ST depressions (V4-6   Interpretation Abnormal EKG with Laterl & Inferior changes - consider ischemia.   EKG Comparision Changed from  10/2012   Radiology Results: XRay:    01-Nov-15 14:06, Chest Portable Single View  Chest Portable Single View   REASON FOR EXAM:    chest pain SOB  COMMENTS:       PROCEDURE: DXR - DXR PORTABLE CHEST SINGLE VIEW  - Nov 27 2013  2:06PM     CLINICAL DATA:  Chest pain and shortness of breath    EXAM:  PORTABLE CHEST - 1 VIEW    COMPARISON:  08/01/2013    FINDINGS:  The heart size and mediastinal contours are within  normal limits.  Both lungs are clear. The visualized skeletal structures are  unremarkable.     IMPRESSION:  No active disease.      Electronically Signed    By: Inez Catalina M.D.    On: 11/01/201514:54         Verified By: Everlene Farrier, M.D.,    No Known Allergies:    Impression Now 60 y/o man (birthday today) with known single vessel CAD that has been stable for ~2 years on medical management presenting with ACS/NSTEMI that began last PM.  Sx associated with ischemic diastolic HF (elevated proBNP) & PND/Orhtopnea Dyspnea. Troponin + (not  sure if this is the peak or if it will continue to trend up). Currently almost angina free on IV Heparin.  1.  NSTEMI: Admit to CCU on IV Heparin & NTG for angina relief.  Cycle markers, AM EKG. * Will plan for [facility] [address] +/- PCI in AM given acute Sx change & NSTEMI -- procedure withy R/B/A/I explained to the patient in detail.  Risks include, but not limited to: Death, MI, CVA, VF-VT, Acute CHF, vascular or coronary injury requiring emergent CT or Vascular Sgx, contrast nephropathy, bleeding/bruising, as well as radiation exposure with risk of Cancer & skin burns.  --- pre-cath orders writted for NPO aftern MN & AM IVF hydration. * BP was mildly elevated on initial eval - should improve with IV NTG.  Would dose IV Lopressor 5 mg x 1 to reduce HR & decrease cardiac demand  * continue current BB & ACE-I dose  2. Acute on Chronic Diastolic HF with elevated proBNP in setting of NSTEMI * check 2 D Echo * afterload/preload reduction with NTG - may need to consdier increasing diuretic.  3.  HTN: BP currently stable -- anticipate continuing home meds - titrate based upon o/n readings. 4.  HLD: on Statin 5.  Pain control for chronic LBP.  Will be seen in AM by Dr. Fletcher Anon.   Electronic Signatures: Leonie Man (MD)  (Signed [phone] 17:04)  Authored: General Aspect/Present Illness, History and Physical Exam, Review of System, Family & Social  History, Past Medical History, Home Medications, Labs, EKG , Radiology, Allergies, Impression/Plan   Last Updated: 01-Nov-15 17:04 by Leonie Man (MD)

## 2014-05-20 NOTE — Consult Note (Signed)
PATIENT NAME:  Walter [name], Walter [name] MR#:  [id number] DATE OF BIRTH:  1955-06-02  DATE OF CONSULTATION:  07/15/2013  REFERRING PHYSICIAN:   CONSULTING PHYSICIAN:  Adah Salvageichard [name]. Excell Seltzerooper, MD  CHIEF COMPLAINT: Abdominal pain.   HISTORY OF PRESENT ILLNESS: This is a patient who was admitted to the hospital and approximately five days ago underwent a laparoscopic appendectomy and drainage of subphrenic abscess by Dr. Anda KraftMarterre. He was discharged yesterday, but returned because he stated he was having increasing abdominal pain. No nausea or vomiting. No fevers or chills. Normal bowel movement. Not passing much gas, however, but no vomiting and no nausea. He just had questions concerning his drain. He has a drain that was placed and is draining serous fluid. The patient states that he is taking his antibiotic as directed as well as pain medications.   PAST MEDICAL HISTORY: Tobacco abuse, coronary artery disease, hypertension, hyperlipidemia, congestive heart failure, chronic obstructive pulmonary disease.   MEDICATIONS: Multiple; see chart.   ALLERGIES: None.   FAMILY HISTORY: Noncontributory.   SOCIAL HISTORY: The patient is a chronic tobacco user, but states he has not smoked since being in the hospital. Does not drink alcohol.   REVIEW OF SYSTEMS: A 10 system review is performed and negative with the exception of that mentioned in the history of present illness.   PHYSICAL EXAMINATION: GENERAL: Comfortable-appearing male patient.  VITAL SIGNS: Temperature of 97.8, pulse 72, respirations 18, blood pressure 102/55.  HEAD AND NECK: No scleral icterus. No palpable neck nodes. Poor dentition.  CHEST: Clear to auscultation.  CARDIAC: Regular rate and rhythm.  ABDOMEN: Soft. There are staples on multiple small laparoscopy incisions. A drain is present in the right lower quadrant, draining serous fluid only. No purulence. No erythema. Abdomen is slightly distended but nontympanitic and only minimally tender  on the right side. No peritoneal signs.  EXTREMITIES: Moderate edema.  NEUROLOGIC: Grossly intact.  INTEGUMENT: No jaundice.   DIAGNOSTIC DATA: Reviewing the patient's white blood cell count, he was 16,000 on the day of admission, 07/09/2013, then it went to 13, then 12 on 07/11/2013, and today it is 13 again.   ASSESSMENT AND PLAN: At this point, the patient is at high risk for developing another abscess, as he had a ruptured appendix with subphrenic abscess several days ago. He is on oral antibiotics at this point. He is not showing signs of mechanical small bowel obstruction, in that he is having bowel movements, passing some gas, tolerating a regular diet, and taking his medications as prescribed. There is no need for admission of this patient to the hospital, as he is afebrile. White blood cell count has not changed much from earlier in his hospital stay, and this may be suggestive of him developing an abscess, but CT scan at this point would be confusing at best and is not likely to influence our management in this patient. I discussed with him the signs of an abscess, the fact that he may be back in the next few days with more signs of a clear-cut abscess, at which time a CT scan could be obtained if necessary, and asked him to return to the Emergency Room should he worsen, but at this point he is able to keep his medications down and does not warrant readmission. This was discussed with Dr. Arnoldo MoraleManley, who is in agreement.    ____________________________ Adah Salvageichard [name]. Excell Seltzerooper, MD rec:cg D: 07/15/2013 23:58:41 ET T: 07/16/2013 02:34:46 ET JOB#: [id number]  cc: Adah Salvageichard [name]. Excell Seltzerooper, MD, <Dictator> Gerlene BurdockICHARD  Kerby Nora MD ELECTRONICALLY SIGNED 07/16/2013 19:20

## 2014-05-20 NOTE — H&P (Signed)
PATIENT NAME:  Walter [name], Walter [name] MR#:  [id number] DATE OF BIRTH:  [birth year]  DATE OF ADMISSION:  08/01/2013  REFERRING PHYSICIAN: Dr. Chiquita LothJade Sung.    CHIEF COMPLAINT: Abdominal pain  as presenting complaint to ED  HISTORY OF PRESENT ILLNESS: A 59 year old Caucasian gentleman with past medical history of COPD, hypertension, hyperlipidemia, congestive heart failure diastolic with EF of 55% presenting with abdominal pain. He recently had a ruptured appendix status post surgery. Discharged from  on 07/12/2013, returns back with right lower quadrant abdominal pain. He is evaluated in the Emergency Department, noted to have a small fluid collection. The case was discussed with surgery given his lack of infectious symptoms. They plan to discharge home from the ER. However, during his ER course given Dilaudid. On repeat nursing evaluation he was found to be unresponsive with oxygen saturation into the 50s. He was given Narcan and placed on BiPAP therapy with initial improvement. I am being asked for admission for respiratory failure   REVIEW OF SYSTEMS: Currently the patient is unable to provide meaningful information given mental status and medical condition. Review of systems unable to obtain given the patient's mental status and medical condition.   PAST MEDICAL HISTORY: Coronary artery disease, COPD, diastolic congestive heart, EF of 50% to 55%, hyperlipidemia, hypertension.   SOCIAL HISTORY: Positive for tobacco use. No alcohol or drug use documented.   FAMILY HISTORY: Positive for hypertension as well as diabetes.   ALLERGIES: No known drug allergies.   HOME MEDICATIONS: Include acetaminophen, oxycodone 325/5 mg p.o. q. 6 hours as need for pain, aspirin 81 mg p.o. daily, enalapril 5 mg p.o. daily, gabapentin 800 mg p.o. 4 times daily, Zofran 4 mg p.o. 3 times a day as needed for nausea, vomiting, atorvastatin 40 mg p.o. at bedtime, Xanax extended release 1 mg p.o. b.i.d., Coreg 12.5 mg  p.o. b.i.d., Advair 250/50 mcg inhalation 1 puff b.i.d., Ventolin 90 mcg inhalation 2 puffs 4 times a daily as needed for shortness of breath, Colace 100 mg p.o. 3 times daily.   PHYSICAL EXAMINATION:  VITAL SIGNS: Temperature 98, heart rate 87, respirations 18, blood pressure 106/65, saturating 998% on room air. This was on arrival, currently saturating 98% on BiPAP therapy. Lowest O2 saturations of mid 50s on room air. Weight 110.2 kg, BMI of 34.9. GENERAL: Ill-appearing Caucasian gentleman in moderate distress given respiratory and mental status requiring BiPAP therapy.  HEAD: Normocephalic, atraumatic.  EYES: Pupils equal, round, reactive to light. Extraocular muscles intact. No scleral icterus.  MOUTH: Moist mucous membranes. Dentition intact. No abscess noted.  EARS, NOSE, AND THROAT: Clear without exudates. No external lesions.  NECK: Supple. No thyromegaly. No nodules. No JVD.  PULMONARY: Diminished breath sounds throughout all lung fields. However, no frank wheezes, rales, or rhonchi. Poor air entry bilaterally.  CHEST:  Nontender on palpation. He is requiring BiPAP at this time.  CARDIOVASCULAR: S1, S2, regular rate and rhythm. No murmurs, rubs, or gallops. No edema. Pedal pulses 2+ bilaterally.  GASTROINTESTINAL: Soft, nontender, nondistended, obese. Hypoactive bowel sounds. No hepatosplenomegaly.  MUSCULOSKELETAL: No swelling, clubbing, or edema. Range of motion is full in all extremities. NEUROLOGIC: Cranial nerves unable to fully assess given patient's mental status. He will respond appropriately to painful stimuli and will open his eyes to verbal stimuli though extremely somnolent and falls back asleep almost instantaneously.  SKIN: No ulcerations, lesions, no rashes, or cyanosis. Skin warm and dry. Turgor intact. PSYCHIATRIC: Unable to fully assess given patient's current mental status and medical condition.  Once again he is markedly lethargic and somnolent on BiPAP therapy. However,  he does respond to painful stimuli as well as responds minimally to verbal stimuli.  LABORATORY DATA: Sodium 138, potassium 4.6, chloride 102, bicarbonate 31, BUN 19, creatinine 1, glucose 96. LFTs: Protein 8.3, albumin 3.2, otherwise within normal limits. WBC 9.7, hemoglobin 11.6, platelets of 224,000. Urinalysis negative for evidence of infection. ABG performed 7.27, CO2 of 69, O2 of 180. This was on nonrebreather.   Chest x-ray performed. Stable cardiomegaly, mild chronic interstitial changes; however, no acute cardiopulmonary process. CT abdomen and pelvis performed residual inflammatory changes in right abdomen along with the inferior margin of the liver and anterior perirenal space. There is a 17-mm fluid collection.   ASSESSMENT AND PLAN: A 59 year old gentleman with history of chronic obstructive pulmonary disease, diastolic congestive heart failure presenting with abdominal pain. During his ER records received narcotics, then became unresponsive and hypoxic.  1. Acute respiratory failure with hypercapnia hypoxia due to narcotics. Avoid oversedating agents. Continue on BiPAP therapy. Recheck ABG in 30 to 60 minutes post initiation of BiPAP. If mental status declines, ABG not improving may require intubation. Provide DuoNeb treatments q. 4 hours. Supplemental oxygen as stated above. No medications for antibiotics at this time.  2. Chronic obstructive pulmonary disease. Continue Advair as well as p.r.n. nebulizers.  3. Hypertension. Continue Vasotec, Coreg. 4. Hyperlipidemia. Continue statin therapy.  5. Right lower quadrant pain, small fluid collection as mentioned above. Evaluated by surgery. Will follow up as an outpatient.  6. Venous thromboembolism prophylaxis with heparin subcutaneous.   CODE STATUS: The patient is full code.   CRITICAL CARE TIME SPENT: 45 minutes.     ____________________________ Cletis Athens. Apryll Hinkle, MD dkh:lt D: 08/01/2013 00:59:42 ET T: 08/01/2013 03:12:24  ET JOB#: [id number]  cc: Cletis Athens. Sierah Lacewell, MD, <Dictator> Trusten Hume Synetta Shadow MD ELECTRONICALLY SIGNED 08/02/2013 1:51

## 2014-05-20 NOTE — Discharge Summary (Signed)
PATIENT NAME:  Walter [name], Walter [name] MR#:  [id number] DATE OF BIRTH:  [birth year]  DATE OF ADMISSION:  07/09/2013 DATE OF DISCHARGE:  07/14/2013  PRINCIPAL DIAGNOSIS: Ruptured acute appendicitis with subphrenic abscess.   OTHER DIAGNOSES: Coronary artery disease, diastolic congestive heart failure, morbid obesity, chronic back pain, peripheral neuropathy, dyslipidemia, history of myocardial infarction, chronic obstructive pulmonary disease, hypertension, history of kidney stones.   PRINCIPAL PROCEDURE PERFORMED DURING THIS ADMISSION: On 07/11/2013, a laparoscopic appendectomy and drainage of right subphrenic abscess.   HOSPITAL COURSE: The patient was admitted to the internal medicine service and ultimately a CT scan was obtained, which showed acute appendicitis with rupture and he was taken to the operating room where he underwent the above-mentioned procedure for the above-mentioned diagnosis. Postoperatively he was afebrile. His pain was controlled with a patient-controlled analgesia machine and this was converted to Toradol. His Foley catheter was removed. His diet was advanced. He was tolerating a regular diet and he had approximately 100 mL out of his Jackson-Pratt drain per 24 hours which was clear, and he was discharged home on his prehospital medication regimen and also Augmentin 875/125 q. 12 hours and Percocet 1 to 2 q. 6 hours p.r.n. pain. He was asked to make an appointment to see me and to call the office in the interim for any problems.     ____________________________ Claude MangesWilliam F. Marterre, MD wfm:lt D: 07/23/2013 12:27:00 ET T: 07/23/2013 23:02:14 ET JOB#: [id number]  cc: Claude MangesWilliam F. Marterre, MD, <Dictator> Claude MangesWILLIAM F MARTERRE MD ELECTRONICALLY SIGNED 07/24/2013 16:40

## 2014-05-20 NOTE — Discharge Summary (Signed)
PATIENT NAME:  Walter [name], Irfan E MR#:  [id number] DATE OF BIRTH:  04-Nov-1955  DATE OF ADMISSION:  08/01/2013 DATE OF DISCHARGE:  08/04/2013  PHYSICIAN: Cletis Athensavid K. Hower, MD  DISCHARGE DIAGNOSES: 1. Acute respiratory failure.  2. Opiate toxicity.  3. Intra-abdominal fluid collection.  SECONDARY DIAGNOSES: 1. Chronic pain syndrome.  2. Anxiety disorder.  3. Hypertension.  4. Asthma.   PROCEDURES: Oral intubation and mechanical ventilation.   IMAGING:  1. Chest x-ray 08/01/2013. 2. CT abdomen and pelvis 07/31/2013 which revealed residual inflammatory change in the right abdomen and a 17-mm fluid collection in the anterior pararenal space.   HISTORY OF PRESENT ILLNESS: This gentleman presented to the Emergency Room complaining of abdominal pain. He most recently had a ruptured appendix, for which he underwent appendectomy in mid-June and at presentation complained of right lower quadrant pain. CT scan reveals imaging as reported above. However, surgery did not deem him to be a surgical candidate. The patient received intravenous Dilaudid for analgesia in the Emergency Room and experienced respiratory decline with the patient requiring oral intubation, mechanical ventilation.   HOSPITAL COURSE: This gentleman was admitted to the Intensive Care Unit. A pulmonology consultation was placed with Dr. Belia HemanKasa. He was initially placed on BiPAP which failed, orally intubated and mechanically ventilated for 2 days. His blood gases looked quite good on evaluation and he was successfully extubated on 08/02/2013. The patient underwent general surgical evaluation; however, they deemed the fluid collection to be surgically amenable. The patient'Vadie Principato arm pain gradually improved and was discharged to home in satisfactory condition.   DISCHARGE INSTRUCTIONS: 1. Diet: Low sodium.  2. Activity: As tolerated.  3. Follow up in 1 to 2 weeks.   DISCHARGE MEDICATIONS: Please forward discharge medical reconciliation  completed by me.   CONSULTATIONS: 1. Pulmonology, Dory LarsenKurian D. Kasa, MD 2. General surgery, Christopher A. Lundquist, MD   DISCHARGE PROCESS TIME: 35 minutes.     ____________________________ Silas FloodSheikh A. Ellsworth Lennoxejan-Sie, MD sat:lm D: 08/22/2013 13:23:18 ET T: 08/23/2013 00:51:15 ET JOB#: [id number]  cc: Sheikh A. Ellsworth Lennoxejan-Sie, MD, <Dictator> Charlesetta GaribaldiSHEIKH A TEJAN-SIE MD ELECTRONICALLY SIGNED 08/23/2013 13:56

## 2014-05-24 ENCOUNTER — Telehealth: Payer: Self-pay

## 2014-05-24 NOTE — Telephone Encounter (Signed)
Received notification from [facility] that they have contacted pt multiple times to schedule pt for cardiac rehab, but he "declined due to personal reasons."

## 2014-05-25 ENCOUNTER — Encounter: Payer: Self-pay | Admitting: *Deleted

## 2014-07-11 ENCOUNTER — Telehealth: Payer: Self-pay

## 2014-07-11 NOTE — Telephone Encounter (Signed)
Pt states his pain medication has changed and thinks it is dropping his BP . States this morning it is 129/71, yesterday it was 54/48, called EMS they checked it  And it was 95/61. States he is dizzy, and "staggering" Please call. States he has an appt on 7/18

## 2014-07-11 NOTE — Telephone Encounter (Signed)
S/w patient who states BP has been running low since Pain Clinic adjusted his medications two months ago. States he was taking Percocet for back pain and now taking Oxycontin plus Percocet as needed to manage back pain as he is unable to have surgery at this time.  Indicates he takes coreg as well.  Feels dizzy and unsteady on his feet. Checks BP regularly and reports 80s systolic frequently. Did not take oxycontin this morning and reports BP 129/61.  States he does not like the way oxycontin makes him feel and it is not controlling his pain like Percocet does. Patient feels all it does is drop his blood pressure and make him feel bad. Suggested patient contact Pain Clinic to report findings, continue to monitor BP, elevate feet if BP is low, drink fluids and to hold coreg if BP is low. Pt verbalized understanding and stated he would call Pain Clinic.

## 2014-07-25 ENCOUNTER — Telehealth: Payer: Self-pay | Admitting: Cardiovascular Disease

## 2014-07-25 NOTE — Telephone Encounter (Signed)
Heather RN aware.  In a patient room.  Will call patient back in a few minutes.  Patient is instructed and agreeable  to wait for nurse to call right back.

## 2014-07-25 NOTE — Telephone Encounter (Signed)
I called and spoke with the patient. [place], but will be coming back this afternoon. He reports to me today that he was switched from Percocet to Oxycontin about 2 months ago by the pain clinic and since that time, he has been having problems with his BP dropping.  On average, his SBP is usually in the 90's.  Today he woke up and took his oxycontin about 6:30 am, at 9:15 am his BP was 94/67, and at 9:45 am he was 122/86.  He does have episodes of dizziness and lightheaded and presyncope when standing/ changing position.  He is currently on his pain medication due to problems with 4 disk in his back. He is due to follow up with the pain clinic tomorrow. He wanted a letter from Dr. Kirke CorinArida stating he should not take oxycontin- he feels only percocet work for him. I advised that he speak with the pain clinic about this tomorrow as Dr. Kirke CorinArida is currently out of the country. I also advised I would forward to the PA to review and see if we may need to cut back on some of his current BP medications- doses confirmed with the patient. He will be home around 3:00 pm today and when calling back, he asked that we call him at [phone].

## 2014-07-25 NOTE — Telephone Encounter (Signed)
Pt c/o BP issue: STAT if pt c/o blurred vision, one-sided weakness or slurred speech  1. What are your last 5 BP readings? This morning:  9:15  94/67   HR 77   last reading but has been lower at times  Asked to take while on phone: 09:45    122/86 HR 74       Says digital bp machine is saying he is having an irregular Heart Beat     2. Are you having any other symptoms (ex. Dizziness, headache, blurred vision, passed out)?  Dizzy, headache, feels like eyes are crossed,  Felt like he might pass out when changing positions from sit to stand, not feeling good   3. What is your BP issue? Wants to know if we can maybe see him today later this afternoon    Concerned bp is running low because of oxycontin he got from pain clinic they switched him from percocet which he has been taking for years and has been effective   [place] has a companion with and they are able to drive if needed

## 2014-07-25 NOTE — Telephone Encounter (Signed)
Attempted to call the patient back at [phone] as instructed by the patient. Message received that the "wireless customer is not available." Will call back.

## 2014-07-25 NOTE — Telephone Encounter (Signed)
Patient will have to speak to pain clinic regarding this matter as it appears to be related to dosing of his pain medications. If it continues perhaps a BP medication could be pursued.

## 2014-07-27 ENCOUNTER — Other Ambulatory Visit: Payer: Self-pay

## 2014-07-27 DIAGNOSIS — D649 Anemia, unspecified: Secondary | ICD-10-CM | POA: Insufficient documentation

## 2014-07-27 NOTE — Telephone Encounter (Signed)
I spoke with the patient today. He stated he did see the pain clinic yesterday and his pain medications were not changed. He was told by the pain clinic to take a 1/2 of his usual dose of lasix for low BP, but per the patient he only take this PRN. The patient reports his BP this morning prior to medications was 127/83 and after his morning meds he was 90/62. He states he falls asleep easily during the day. Per the patient, he is scheduled to follow up around 08/14/14 with his PCP. He is to go for labs prior to this appointment. He reports today, that he has had anemia in the past requiring an iron transfusion. I advised he should have his repeat labs in the near future and keep follow up with his PCP and Dr. Kirke CorinArida (08/14/14). He should continue to record and monitor his BP's until he is seen. He was advised to bring the readings with him. He voices understanding.

## 2014-07-28 ENCOUNTER — Inpatient Hospital Stay: Payer: Medicaid Other | Attending: Internal Medicine

## 2014-07-28 ENCOUNTER — Inpatient Hospital Stay: Payer: Medicaid Other | Admitting: Internal Medicine

## 2014-08-14 ENCOUNTER — Ambulatory Visit (INDEPENDENT_AMBULATORY_CARE_PROVIDER_SITE_OTHER): Payer: Medicaid Other | Admitting: Cardiovascular Disease

## 2014-08-14 ENCOUNTER — Encounter: Payer: Self-pay | Admitting: Cardiovascular Disease

## 2014-08-14 ENCOUNTER — Other Ambulatory Visit: Payer: Self-pay

## 2014-08-14 VITALS — BP 107/74 | HR 74 | Ht 70.0 in | Wt 224.5 lb

## 2014-08-14 DIAGNOSIS — I10 Essential (primary) hypertension: Secondary | ICD-10-CM

## 2014-08-14 DIAGNOSIS — I5022 Chronic systolic (congestive) heart failure: Secondary | ICD-10-CM

## 2014-08-14 DIAGNOSIS — I2510 Atherosclerotic heart disease of native coronary artery without angina pectoris: Secondary | ICD-10-CM

## 2014-08-14 DIAGNOSIS — Z720 Tobacco use: Secondary | ICD-10-CM

## 2014-08-14 DIAGNOSIS — R0989 Other specified symptoms and signs involving the circulatory and respiratory systems: Secondary | ICD-10-CM | POA: Diagnosis not present

## 2014-08-14 DIAGNOSIS — E785 Hyperlipidemia, unspecified: Secondary | ICD-10-CM

## 2014-08-14 MED ORDER — ATORVASTATIN CALCIUM 40 MG PO TABS: 40.0000 mg | ORAL_TABLET | Freq: Every day | ORAL | Status: AC

## 2014-08-14 NOTE — Progress Notes (Signed)
HPI  This is a 59 year old male who is here today for a followup visit regarding coronary artery disease, chronic diastolic/systolic heart failure and labile hypertension.  He underwent diagnostic catheterization in November of 2013 secondary to a non-STEMI following back surgery. He was found to have a subtotal occlusion of the right coronary artery with good left-to-right collaterals and was medically managed. He was treated with Plavix for one year.  He presented to [facility] on 11/27/13 with NSTEMI.  He underwent a LHC and found to have an occluded codominant RCA and high-grade codominant proximal mid circumflex disease. He underwent stenting of his circumflex with a 4 mm x 23 mm long Xience drug-eluting stent. Several hours post procedure, he developed flash pulmonary edema. He underwent a repeat cath which demonstrated a widely patent circumflex stent and mid LAD disease with only 50% stenosis. He improved with IV Lasix.  A 2D echo was obtained and demonstrated mildly reduced systolic function with an EF of 45-50% with mild inferior hypokinesis. Grade I diastolic dysfunction was also noted as well as mild MR.  He has been doing reasonably well and denies any chest pain. He has stable exertional dyspnea. Unfortunately, he continues to smoke. He continues to have episodes of hypotension and sometimes he has to hold carvedilol. His blood pressure goes up when he is under pain from chronic back issues. He needs to have back surgery done  but this was postponed until dual antiplatelets therapy can be interrupted.  Allergies  Allergen Reactions  . Ibuprofen     Increases blood pressure  . Tramadol     Increases blood pressure     Current Outpatient Prescriptions on File Prior to Visit  Medication Sig Dispense Refill  . acetaminophen (TYLENOL) 500 MG tablet Take 1,000 mg by mouth every 6 (six) hours as needed. pain    . aspirin EC 81 MG tablet Take 81 mg by mouth daily.     Marland Kitchen atorvastatin (LIPITOR)  40 MG tablet Take 1 tablet (40 mg total) by mouth every evening. 30 tablet 3  . Blood Pressure Monitoring (B-D ASSURE BPM/AUTO ARM CUFF) MISC 1 Device by Does not apply route daily. 1 each 0  . carvedilol (COREG) 6.25 MG tablet Take 1 tablet (6.25 mg total) by mouth 2 (two) times daily. 60 tablet 6  . enalapril (VASOTEC) 5 MG tablet Take 1 tablet (5 mg total) by mouth daily. 30 tablet 6  . Fluticasone-Salmeterol (ADVAIR DISKUS) 250-50 MCG/DOSE AEPB Inhale 1 puff into the lungs every 12 (twelve) hours.    . furosemide (LASIX) 20 MG tablet Take 1 tablet (20 mg total) by mouth daily. (Patient taking differently: Take 20 mg by mouth as needed. ) 30 tablet 5  . gabapentin (NEURONTIN) 800 MG tablet Take 800 mg by mouth 4 (four) times daily.    . Ipratropium-Albuterol (COMBIVENT) 20-100 MCG/ACT AERS respimat Inhale 2 puffs into the lungs 2 (two) times daily. Frequency:QID   Dosage:0.0     Instructions:  Note:Dose: 20-100 MCG    . nitroGLYCERIN (NITROSTAT) 0.4 MG SL tablet Place 1 tablet (0.4 mg total) under the tongue every 5 (five) minutes x 3 doses as needed for chest pain. 25 tablet 5  . oxyCODONE-acetaminophen (PERCOCET) 10-325 MG per tablet Take 1 tablet by mouth every 4 (four) hours as needed for pain.     . ticagrelor (BRILINTA) 90 MG TABS tablet Take 1 tablet (90 mg total) by mouth 2 (two) times daily. 60 tablet 0  . tiotropium (  SPIRIVA) 18 MCG inhalation capsule Place 18 mcg into inhaler and inhale daily.     No current facility-administered medications on file prior to visit.     Past Medical History  Diagnosis Date  . Hypertension   . Degenerative joint disease of spine     a. 11/2011 s/p L4-L5 fusion.  . Chronic back pain   . Chronic diastolic CHF (congestive heart failure)   . COPD (chronic obstructive pulmonary disease)   . CAD (coronary artery disease)     a. 11/2011 NSTEMI/Cath: subtotal occlusion of the mid RCA with L->R collats, nl LV fxn-->medical Rx;  . Nephrolithiasis   .  Tobacco abuse     a. 80+ pack year hx  . Rupture of rotator cuff of shoulder     left  . Anemia   . MI (myocardial infarction)      Past Surgical History  Procedure Laterality Date  . Shoulder surgery    . Knee surgery    . Wrist surgery    . Nose surgery    . Carpal tunnel release    . Kidney stone      removed  . L4-l5 spine surgery  2013  . Cardiac catheterization  12/15/2011    NSTEMI - Subtotalled RCA - Med Rx  . Cardiac catheterization  11/28/2013  . Left heart catheterization with coronary angiogram N/A 11/28/2013    Procedure: LEFT HEART CATHETERIZATION WITH CORONARY ANGIOGRAM;  Surgeon: Runell GessJonathan J Berry, MD;  [facility];  Service: Cardiovascular;  Laterality: N/A;     Family History  Problem Relation Age of Onset  . Family history unknown: Yes     History   Social History  . Marital Status: Single    Spouse Name: N/A  . Number of Children: N/A  . Years of Education: N/A   Occupational History  . Not on file.   Social History Main Topics  . Smoking status: Current Every Day Smoker -- 0.50 packs/day for 42 years    Types: Cigarettes  . Smokeless tobacco: Not on file  . Alcohol Use: No     Comment: seldom  . Drug Use: No     Comment: past  . Sexual Activity: Not on file   Other Topics Concern  . Not on file   Social History Narrative      PHYSICAL EXAM   BP 107/74 mmHg  Pulse 74  Ht 5\' 10"  (1.778 m)  Wt 224 lb 8 oz (101.833 kg)  BMI 32.21 kg/m2 Constitutional: He is oriented to person, place, and time. He appears well-developed and well-nourished. No distress.  HENT: No nasal discharge.  Head: Normocephalic and atraumatic.  Eyes: Pupils are equal and round. Right eye exhibits no discharge. Left eye exhibits no discharge.  Neck: Normal range of motion. Neck supple. No JVD present. No thyromegaly present. There is a faint left carotid bruit. Cardiovascular: Normal rate, regular rhythm, normal heart sounds and. Exam reveals no  gallop and no friction rub. No murmur heard.  Pulmonary/Chest: Effort normal and breath sounds normal. No stridor. No respiratory distress. He has no wheezes. He has no rales. He exhibits no tenderness.  Abdominal: Soft. Bowel sounds are normal. He exhibits no distension. There is no tenderness. There is no rebound and no guarding.  Musculoskeletal: Normal range of motion. He exhibits trace edema and no tenderness.  Neurological: He is alert and oriented to person, place, and time. Coordination normal.  Skin: Skin is warm and dry. No rash  noted. He is not diaphoretic. No erythema. No pallor.  Psychiatric: He has a normal mood and affect. His behavior is normal. Judgment and thought content normal.      ASSESSMENT AND PLAN

## 2014-08-14 NOTE — Patient Instructions (Signed)
Medication Instructions:  Your physician recommends that you continue on your current medications as directed. Please refer to the Current Medication list given to you today.   Labwork: none  Testing/Procedures: Your physician has requested that you have a carotid duplex. This test is an ultrasound of the carotid arteries in your neck. It looks at blood flow through these arteries that supply the brain with blood. Allow one hour for this exam. There are no restrictions or special instructions.    Follow-Up: Your physician wants you to follow-up in: six months with Dr. Arida.  You will receive a reminder letter in the mail two months in advance. If you don't receive a letter, please call our office to schedule the follow-up appointment.   Any Other Special Instructions Will Be Listed Below (If Applicable).   

## 2014-08-14 NOTE — Assessment & Plan Note (Signed)
Continue treatment with atorvastatin with a target LDL of less than 70. This was refilled today.

## 2014-08-14 NOTE — Assessment & Plan Note (Signed)
I again discussed with him the importance of smoking cessation but he is not able to quit.

## 2014-08-14 NOTE — Assessment & Plan Note (Signed)
I requested carotid Doppler. He is a smoker and has multiple risk factors for atherosclerosis.

## 2014-08-14 NOTE — Assessment & Plan Note (Signed)
Blood pressure is well controlled today. He continues to have episodes of symptomatic hypotension. I asked him to hold carvedilol on days when his blood pressure is low. I elected not to decrease the dose because he tends to have significantly elevated blood pressure especially when he is having significant back pain.

## 2014-08-14 NOTE — Assessment & Plan Note (Signed)
He is doing very well with no symptoms suggestive of angina. Continue medical therapy. He is to stay on dual antiplatelets therapy for at least one year until November 2016. He is waiting to have back surgery done.

## 2014-08-14 NOTE — Assessment & Plan Note (Signed)
He appears to be euvolemic and uses furosemide only as needed.

## 2014-08-22 ENCOUNTER — Ambulatory Visit (INDEPENDENT_AMBULATORY_CARE_PROVIDER_SITE_OTHER): Payer: Medicaid Other

## 2014-08-22 DIAGNOSIS — R0989 Other specified symptoms and signs involving the circulatory and respiratory systems: Secondary | ICD-10-CM | POA: Diagnosis not present

## 2014-09-14 ENCOUNTER — Other Ambulatory Visit: Payer: Self-pay | Admitting: Cardiovascular Disease

## 2014-11-01 IMAGING — CT CT ABD-PELV W/ CM
2 of 5 series · 16 of 46 positions shown, 18 images · IV contrast (agent unspecified)
Comparison: CT scan of the abdomen and pelvis of July 31, 2013

CLINICAL DATA: Nine day history of diarrhea; history of surgery for
ruptured appendix in [REDACTED]

EXAM:
CT ABDOMEN AND PELVIS WITH CONTRAST
TECHNIQUE: Multidetector CT imaging of the abdomen and pelvis was performed
using the standard protocol following bolus administration of
intravenous contrast.
CONTRAST:  100 cc of [id number] intravenously ; the patient also
received oral contrast material.

[Series 2: routine abd pel with · axial · 0.88mm/px · z∈[-951,-526]mm · 13 of 95 slices shown, 15 images]
[im 5/95  soft-tissue]
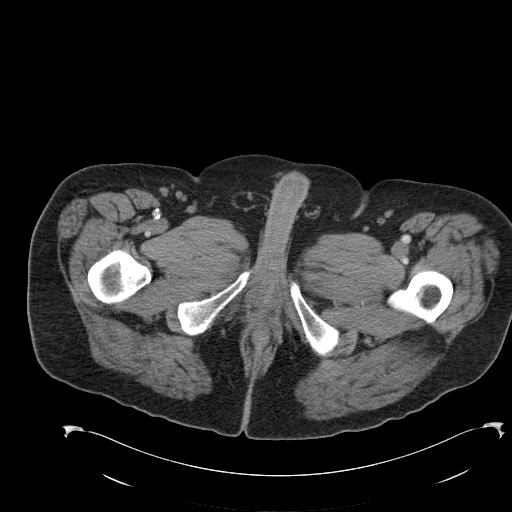
[im 5/95  bone]
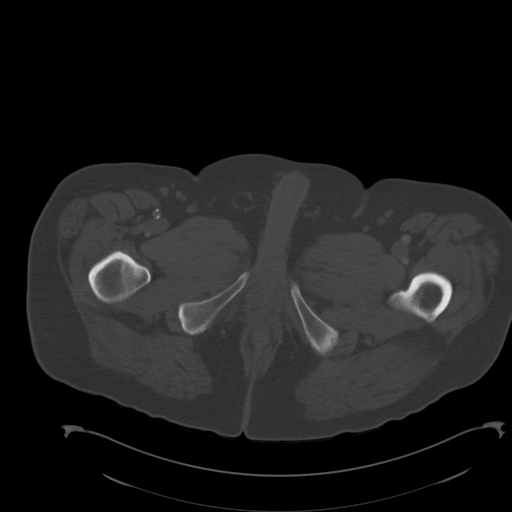
[im 15/95  soft-tissue]
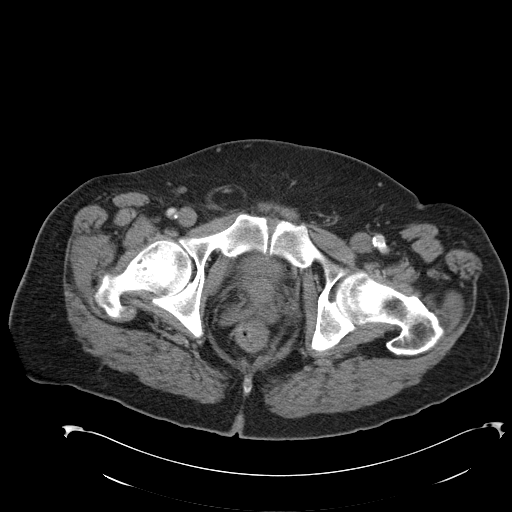
[im 20/95  soft-tissue]
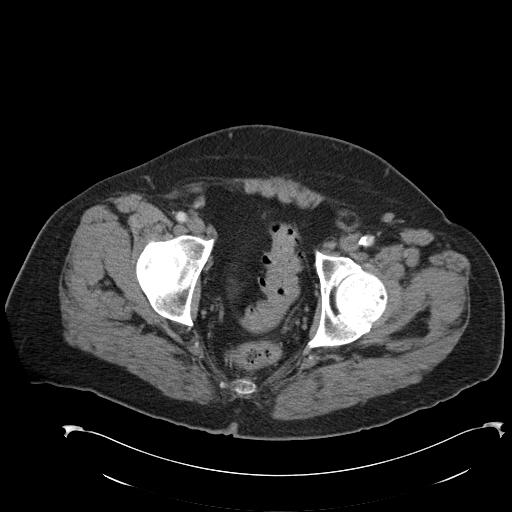
[im 25/95  soft-tissue]
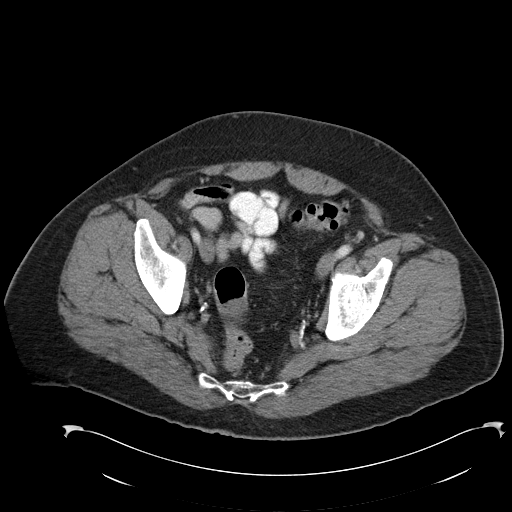
[im 35/95  soft-tissue]
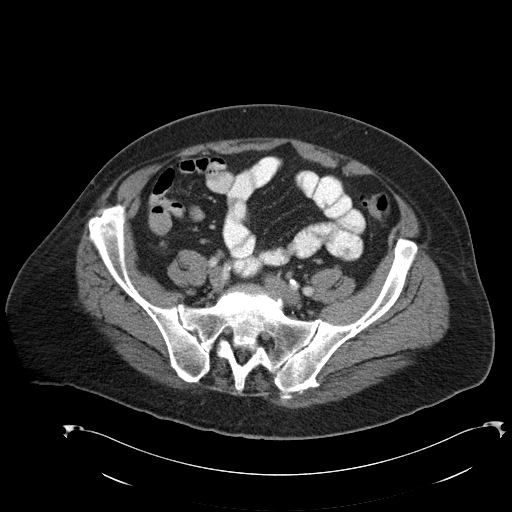
[im 40/95  soft-tissue]
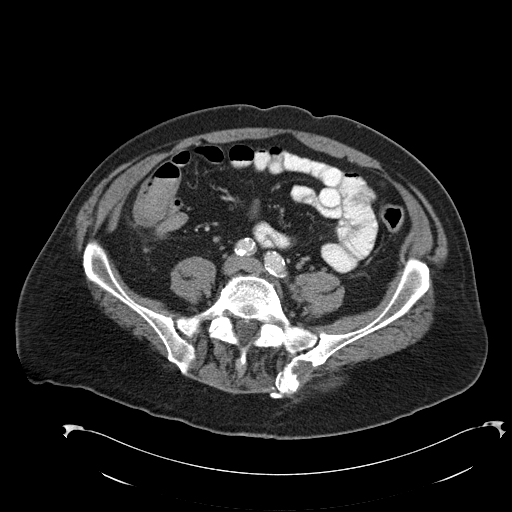
[im 50/95  soft-tissue]
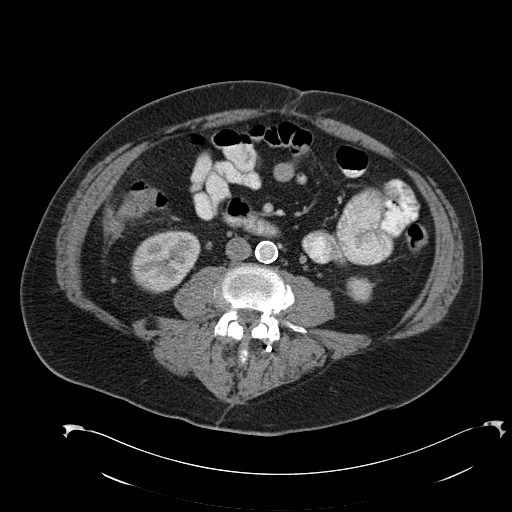
[im 55/95  soft-tissue]
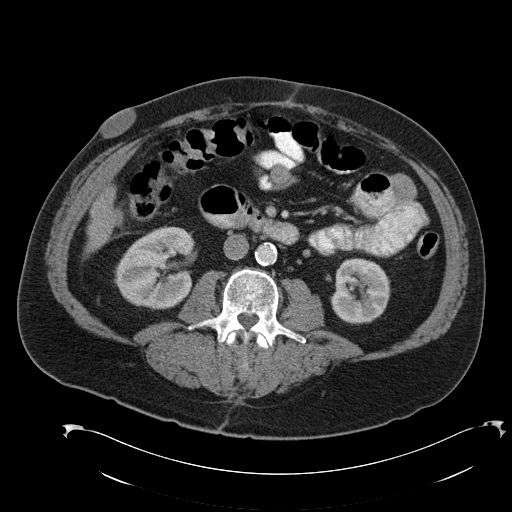
[im 60/95  soft-tissue]
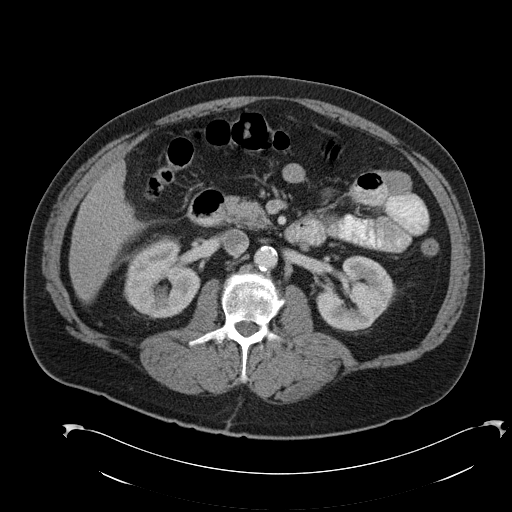
[im 60/95  bone]
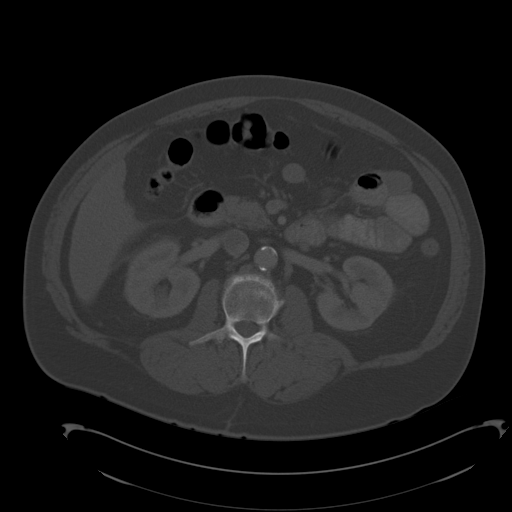
[im 70/95  soft-tissue]
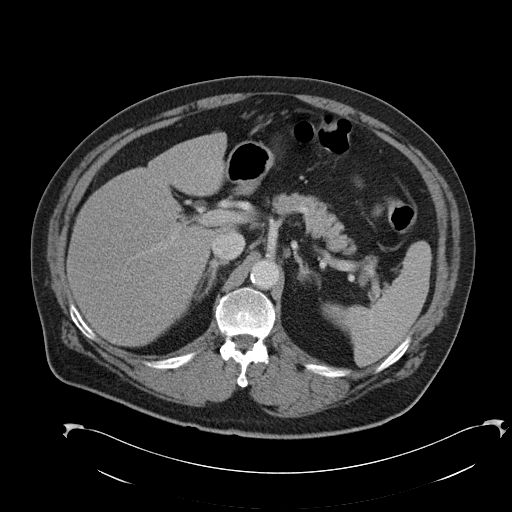
[im 75/95  soft-tissue]
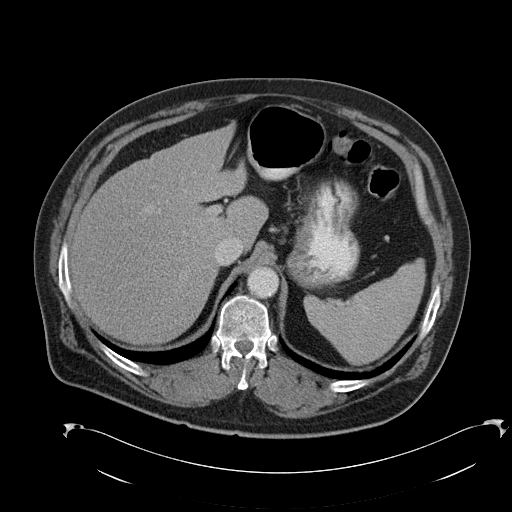
[im 80/95  soft-tissue]
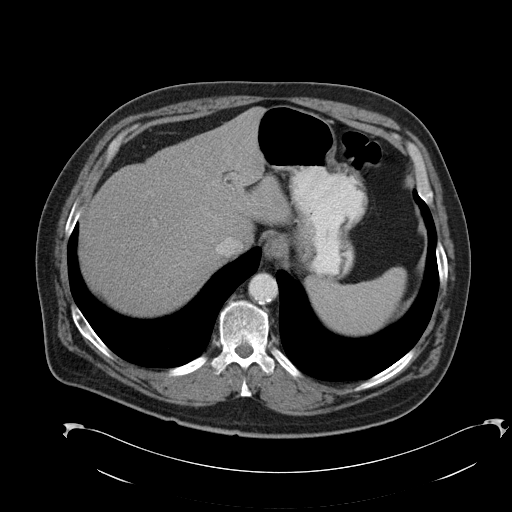
[im 90/95  soft-tissue]
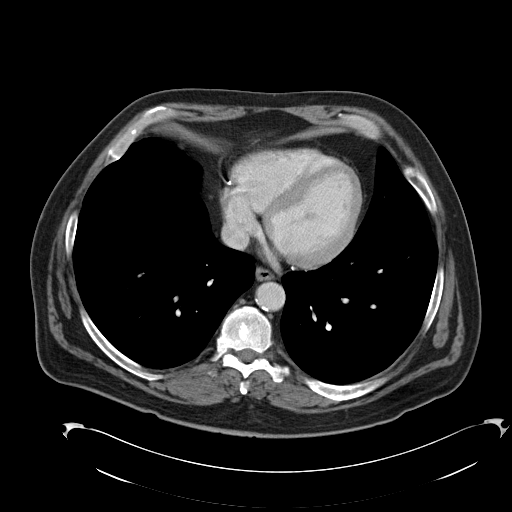

[Series 5: cor routine abd pel with · coronal · 0.78mm/px · 3 of 148 slices shown]
[im 50/148  soft-tissue]
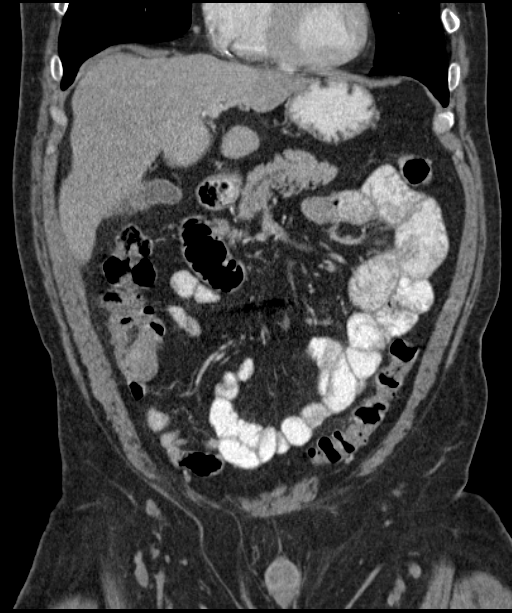
[im 66/148  soft-tissue]
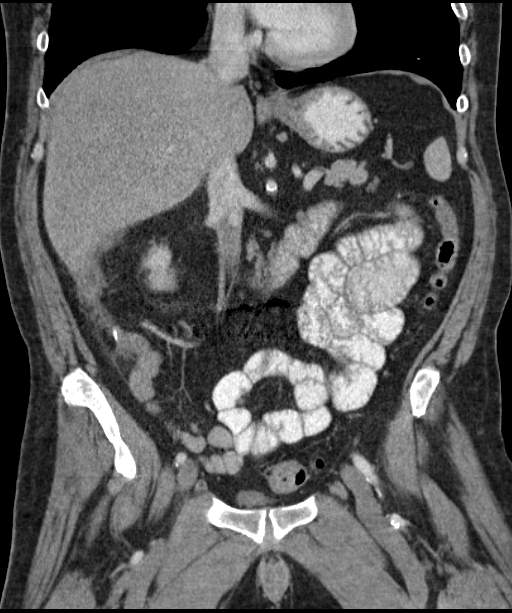
[im 82/148  soft-tissue]
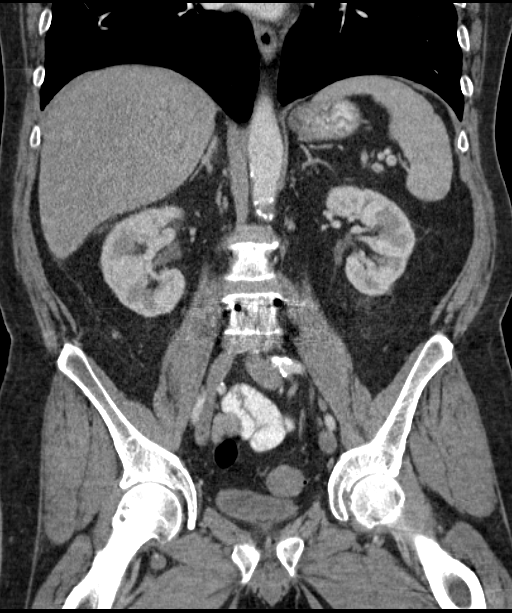

[16 of 46 positions shown; findings below may reference images not displayed]

FINDINGS: The previously demonstrated soft tissue density in the anterior
right pararenal space is again demonstrated and is increased
somewhat in size and and extends inferiorly to the previous surgical
site. Here there is a metallic clip and a structure which may
reflect an appendiceal remnant or stump that is borderline dilated.
The area of concern extends from images 37 through 54. A small fluid
collection with minimal rim enhancement measures approximately
cm AP x 2 cm transversely on image 44. The findings are closely
applied to the cecum. Elsewhere the colon exhibits no inflammatory
changes. There is sigmoid diverticulosis The small bowel exhibits no
evidence of enteritis or obstruction.

The liver, gallbladder, pancreas, spleen, stomach, adrenal glands,
and kidneys exhibit no acute abnormalities. There is atherosclerotic
mural calcification throughout the abdominal aorta and iliac
vessels. The prostate gland and seminal vesicles are normal. There
are shallow fat containing inguinal hernias bilaterally.

There is a stable subcutaneous ovoid soft tissue over the right
lower anterior abdominal wall consistent with a sebaceous cyst. The
patient has undergone previous posterior fusion at L4-5. The bony
pelvis is unremarkable. The lung bases are clear.
IMPRESSION: 1. Findings consistent with a small abscess at the site of previous
appendectomy. An adjacent/contiguous tubular structure consistent
with an appendiceal remnant or stump is present. The findings have
become more conspicuous since the previous study. Surgical
consultation now is recommended.
2. There is colonic diverticulosis without evidence of acute
diverticulitis.
3. These results were called by telephone at the time of
interpretation on 09/06/2013 at [DATE] to Dr. JABDIEL SATAMARIA ,
who verbally acknowledged these results.

## 2014-11-02 ENCOUNTER — Telehealth: Payer: Self-pay | Admitting: *Deleted

## 2014-11-02 NOTE — Telephone Encounter (Signed)
Request for surgical clearance:  1. What type of surgery is being performed? Back surgery  2. When is this surgery scheduled? Not scheduled yet  3. Are there any medications that need to be held prior to surgery and how long? Berlinta  4. Name of physician performing surgery? Dr. Iline Oven with [facility]  5. What is your office phone and fax number? Patient wants to talk with you first. 6.

## 2014-11-03 NOTE — Telephone Encounter (Signed)
S/w pt.who wants to have back surgery. He does not have a surgery date but has appt w/[facility] in November to discuss. Pt would like to know how long he can stop Brilinta prior to surgery. Advised pt to have surgeons office fax cardiac clearance request to our office. Once surgery date is set, MD can advise regarding Brilinta. Pt verbalized understanding with no further questions.

## 2014-11-03 NOTE — Telephone Encounter (Signed)
S/w pt sister, Malachi Bonds, who states pt asleep. Sister states she will have him return phone call

## 2014-11-14 ENCOUNTER — Other Ambulatory Visit: Payer: Self-pay

## 2014-11-15 MED ORDER — TICAGRELOR 90 MG PO TABS: 90.0000 mg | ORAL_TABLET | Freq: Two times a day (BID) | ORAL | Status: DC

## 2014-11-15 MED ORDER — CARVEDILOL 6.25 MG PO TABS: 6.2500 mg | ORAL_TABLET | Freq: Two times a day (BID) | ORAL | Status: AC

## 2015-01-31 ENCOUNTER — Inpatient Hospital Stay: Payer: Medicaid Other | Attending: Internal Medicine

## 2015-02-12 ENCOUNTER — Telehealth: Payer: Self-pay

## 2015-02-12 ENCOUNTER — Ambulatory Visit (INDEPENDENT_AMBULATORY_CARE_PROVIDER_SITE_OTHER): Payer: Medicaid Other | Admitting: Cardiovascular Disease

## 2015-02-12 ENCOUNTER — Encounter: Payer: Self-pay | Admitting: Cardiovascular Disease

## 2015-02-12 VITALS — BP 136/88 | HR 68 | Ht 70.0 in | Wt 224.5 lb

## 2015-02-12 DIAGNOSIS — I2510 Atherosclerotic heart disease of native coronary artery without angina pectoris: Secondary | ICD-10-CM | POA: Diagnosis not present

## 2015-02-12 DIAGNOSIS — R0989 Other specified symptoms and signs involving the circulatory and respiratory systems: Secondary | ICD-10-CM | POA: Diagnosis not present

## 2015-02-12 DIAGNOSIS — I10 Essential (primary) hypertension: Secondary | ICD-10-CM

## 2015-02-12 DIAGNOSIS — I5022 Chronic systolic (congestive) heart failure: Secondary | ICD-10-CM

## 2015-02-12 DIAGNOSIS — E785 Hyperlipidemia, unspecified: Secondary | ICD-10-CM

## 2015-02-12 NOTE — Assessment & Plan Note (Signed)
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 Continue treatment of risk factors.

## 2015-02-12 NOTE — Progress Notes (Signed)
HPI  This is a 60 year old male who is here today for a followup visit regarding coronary artery disease, chronic diastolic/systolic heart failure and labile hypertension.  He underwent diagnostic catheterization in November of 2013 secondary to a non-STEMI following back surgery. He was found to have a subtotal occlusion of the right coronary artery with good left-to-right collaterals and was medically managed.  [facility] with NSTEMI.  He underwent a LHC and found to have an occluded codominant RCA and high-grade codominant proximal mid circumflex disease. He underwent stenting of his circumflex with a 4 mm x 23 mm long Xience drug-eluting stent. Several hours post procedure, he developed flash pulmonary edema. He underwent a repeat cath which demonstrated a widely patent circumflex stent and mid LAD disease with only 50% stenosis. He improved with IV Lasix.  A 2D echo was obtained and demonstrated mildly reduced systolic function with an EF of 45-50% with mild inferior hypokinesis. Grade I diastolic dysfunction was also noted as well as mild MR.  He has been doing well from a cardiac standpoint with no chest pain or shortness of breath. Enalapril was stopped due to low blood pressure. He has labile hypertension with blood pressure is usually controlled as long as his chronic pain is controlled. He needs to have colonoscopy done.  Allergies  Allergen Reactions  . Ibuprofen     Increases blood pressure  . Tramadol     Increases blood pressure     Current Outpatient Prescriptions on File Prior to Visit  Medication Sig Dispense Refill  . acetaminophen (TYLENOL) 500 MG tablet Take 1,000 mg by mouth every 6 (six) hours as needed. pain    . ALPRAZolam (XANAX) 0.5 MG tablet Take 0.5 mg by mouth 3 (three) times daily.    Marland Kitchen. aspirin EC 81 MG tablet Take 81 mg by mouth daily.     Marland Kitchen. atorvastatin (LIPITOR) 40 MG tablet Take 1 tablet (40 mg total) by mouth daily. 30 tablet 5  .  Blood Pressure Monitoring (B-D ASSURE BPM/AUTO ARM CUFF) MISC 1 Device by Does not apply route daily. 1 each 0  . carvedilol (COREG) 6.25 MG tablet Take 1 tablet (6.25 mg total) by mouth 2 (two) times daily. 60 tablet 3  . Fluticasone-Salmeterol (ADVAIR DISKUS) 250-50 MCG/DOSE AEPB Inhale 1 puff into the lungs every 12 (twelve) hours.    . furosemide (LASIX) 20 MG tablet Take 1 tablet (20 mg total) by mouth daily. (Patient taking differently: Take 20 mg by mouth as needed. ) 30 tablet 5  . gabapentin (NEURONTIN) 800 MG tablet Take 800 mg by mouth 4 (four) times daily.    . Ipratropium-Albuterol (COMBIVENT) 20-100 MCG/ACT AERS respimat Inhale 2 puffs into the lungs 2 (two) times daily. Frequency:QID   Dosage:0.0     Instructions:  Note:Dose: 20-100 MCG    . nitroGLYCERIN (NITROSTAT) 0.4 MG SL tablet Place 1 tablet (0.4 mg total) under the tongue every 5 (five) minutes x 3 doses as needed for chest pain. 25 tablet 5  . OxyCODONE (OXYCONTIN) 20 mg T12A 12 hr tablet Take 30 mg by mouth 2 (two) times daily.     Marland Kitchen. oxyCODONE-acetaminophen (PERCOCET) 10-325 MG per tablet Take 1 tablet by mouth every 4 (four) hours as needed for pain.     Marland Kitchen. tiotropium (SPIRIVA) 18 MCG inhalation capsule Place 18 mcg into inhaler and inhale daily.     No current facility-administered medications on file prior to visit.  Past Medical History  Diagnosis Date  . Hypertension   . Degenerative joint disease of spine     a. 11/2011 s/p L4-L5 fusion.  . Chronic back pain   . Chronic diastolic CHF (congestive heart failure) (HCC)   . COPD (chronic obstructive pulmonary disease) (HCC)   . CAD (coronary artery disease)     a. 11/2011 NSTEMI/Cath: subtotal occlusion of the mid RCA with L->R collats, nl LV fxn-->medical Rx;  . Nephrolithiasis   . Tobacco abuse     a. 80+ pack year hx  . Rupture of rotator cuff of shoulder     left  . Anemia   . MI (myocardial infarction) [facility])      Past Surgical History  Procedure  Laterality Date  . Shoulder surgery    . Knee surgery    . Wrist surgery    . Nose surgery    . Carpal tunnel release    . Kidney stone      removed  . L4-l5 spine surgery  2013  . Cardiac catheterization  12/15/2011    NSTEMI - Subtotalled RCA - Med Rx  . Cardiac catheterization  11/28/2013  . Left heart catheterization with coronary angiogram N/A 11/28/2013    Procedure: LEFT HEART CATHETERIZATION WITH CORONARY ANGIOGRAM;  Surgeon: Runell Gess, MD;  Location: [facility] CATH LAB;  Service: Cardiovascular;  Laterality: N/A;     Family History  Problem Relation Age of Onset  . Family history unknown: Yes     Social History   Social History  . Marital Status: Single    Spouse Name: N/A  . Number of Children: N/A  . Years of Education: N/A   Occupational History  . Not on file.   Social History Main Topics  . Smoking status: Current Every Day Smoker -- 0.50 packs/day for 42 years    Types: Cigarettes  . Smokeless tobacco: Not on file  . Alcohol Use: No     Comment: seldom  . Drug Use: No     Comment: past  . Sexual Activity: Not on file   Other Topics Concern  . Not on file   Social History Narrative      PHYSICAL EXAM   BP 136/88 mmHg  Pulse 68  Ht 5\' 10"  (1.778 m)  Wt 224 lb 8 oz (101.833 kg)  BMI 32.21 kg/m2 Constitutional: He is oriented to person, place, and time. He appears well-developed and well-nourished. No distress.  HENT: No nasal discharge.  Head: Normocephalic and atraumatic.  Eyes: Pupils are equal and round. Right eye exhibits no discharge. Left eye exhibits no discharge.  Neck: Normal range of motion. Neck supple. No JVD present. No thyromegaly present. There is a faint left carotid bruit. Cardiovascular: Normal rate, regular rhythm, normal heart sounds and. Exam reveals no gallop and no friction rub. No murmur heard.  Pulmonary/Chest: Effort normal and breath sounds normal. No stridor. No respiratory distress. He has no wheezes. He has no  rales. He exhibits no tenderness.  Abdominal: Soft. Bowel sounds are normal. He exhibits no distension. There is no tenderness. There is no rebound and no guarding.  Musculoskeletal: Normal range of motion. He exhibits trace edema and no tenderness.  Neurological: He is alert and oriented to person, place, and time. Coordination normal.  Skin: Skin is warm and dry. No rash noted. He is not diaphoretic. No erythema. No pallor.  Psychiatric: He has a normal mood and affect. His behavior is normal. Judgment and thought content  normal.   EKG: Sinus rhythm with a PAC. No significant ST or T wave changes.   ASSESSMENT AND PLAN

## 2015-02-12 NOTE — Telephone Encounter (Signed)
[facility] for [facility]27 colonoscopy [phone]

## 2015-02-12 NOTE — Assessment & Plan Note (Signed)
Continue treatment with atorvastatin with a target LDL of less than 70. He follows with Dr. Ellsworth Lennoxejan-sie about this.

## 2015-02-12 NOTE — Assessment & Plan Note (Signed)
He is doing well overall with no symptoms suggestive of angina. It has been more than one year since his drug-eluting stent placement. Thus, I discontinued Brilinta. Continue low-dose aspirin indefinitely. He is at low risk for colonoscopy from a cardiac standpoint.

## 2015-02-12 NOTE — Patient Instructions (Signed)
Medication Instructions: Stop taking Brilinta.  Continue other medications.   Labwork: None.   Procedures/Testing: None.   Follow-Up: 6 months with Dr. Kirke CorinArida  Any Additional Special Instructions Will Be Listed Below (If Applicable).  You are at low risk for colonoscopy.

## 2015-02-12 NOTE — Assessment & Plan Note (Signed)
This was only in the setting of myocardial infarction. He appears to be euvolemic and has not had to use furosemide in a long time.

## 2015-03-26 ENCOUNTER — Encounter: Admission: RE | Payer: Self-pay | Source: Ambulatory Visit

## 2015-03-26 ENCOUNTER — Ambulatory Visit: Admission: RE | Admit: 2015-03-26 | Payer: Medicaid Other | Source: Ambulatory Visit | Admitting: Gastroenterology

## 2015-03-26 SURGERY — COLONOSCOPY WITH PROPOFOL
Anesthesia: General

## 2015-03-28 DEATH — deceased
